# Patient Record
Sex: Female | Born: 1960 | Race: Black or African American | Hispanic: No | Marital: Married | Smoking: Current every day smoker
Health system: Southern US, Community
[De-identification: ages and names within clinical notes are randomized; demographics above are authoritative.]

## PROBLEM LIST (undated history)

## (undated) DIAGNOSIS — I10 Essential (primary) hypertension: Secondary | ICD-10-CM

## (undated) DIAGNOSIS — S0990XA Unspecified injury of head, initial encounter: Secondary | ICD-10-CM

## (undated) DIAGNOSIS — E039 Hypothyroidism, unspecified: Secondary | ICD-10-CM

## (undated) HISTORY — PX: WISDOM TOOTH EXTRACTION: SHX21

## (undated) HISTORY — PX: ABDOMINAL HYSTERECTOMY: SHX81

---

## 1998-06-21 ENCOUNTER — Other Ambulatory Visit: Admission: RE | Admit: 1998-06-21 | Discharge: 1998-06-21 | Payer: Self-pay | Admitting: Obstetrics and Gynecology

## 1999-07-23 ENCOUNTER — Other Ambulatory Visit: Admission: RE | Admit: 1999-07-23 | Discharge: 1999-07-23 | Payer: Self-pay | Admitting: Gynecology

## 2000-03-29 ENCOUNTER — Emergency Department (HOSPITAL_COMMUNITY): Admission: EM | Admit: 2000-03-29 | Discharge: 2000-03-29 | Payer: Self-pay | Admitting: Emergency Medicine

## 2000-07-30 ENCOUNTER — Other Ambulatory Visit: Admission: RE | Admit: 2000-07-30 | Discharge: 2000-07-30 | Payer: Self-pay | Admitting: Gynecology

## 2001-08-26 ENCOUNTER — Other Ambulatory Visit: Admission: RE | Admit: 2001-08-26 | Discharge: 2001-08-26 | Payer: Self-pay | Admitting: Gynecology

## 2003-08-03 ENCOUNTER — Other Ambulatory Visit: Admission: RE | Admit: 2003-08-03 | Discharge: 2003-08-03 | Payer: Self-pay | Admitting: Gynecology

## 2003-09-04 ENCOUNTER — Encounter (INDEPENDENT_AMBULATORY_CARE_PROVIDER_SITE_OTHER): Payer: Self-pay | Admitting: Specialist

## 2003-09-04 ENCOUNTER — Encounter: Payer: Self-pay | Admitting: Cardiology

## 2003-09-04 ENCOUNTER — Inpatient Hospital Stay (HOSPITAL_COMMUNITY): Admission: AD | Admit: 2003-09-04 | Discharge: 2003-09-07 | Payer: Self-pay | Admitting: Gynecology

## 2004-07-18 ENCOUNTER — Emergency Department (HOSPITAL_COMMUNITY): Admission: EM | Admit: 2004-07-18 | Discharge: 2004-07-19 | Payer: Self-pay | Admitting: Emergency Medicine

## 2004-07-29 ENCOUNTER — Encounter: Admission: RE | Admit: 2004-07-29 | Discharge: 2004-07-29 | Payer: Self-pay | Admitting: Cardiology

## 2004-08-07 ENCOUNTER — Ambulatory Visit (HOSPITAL_COMMUNITY): Admission: RE | Admit: 2004-08-07 | Discharge: 2004-08-07 | Payer: Self-pay | Admitting: Cardiology

## 2004-08-26 ENCOUNTER — Emergency Department (HOSPITAL_COMMUNITY): Admission: EM | Admit: 2004-08-26 | Discharge: 2004-08-26 | Payer: Self-pay | Admitting: Emergency Medicine

## 2005-07-02 ENCOUNTER — Ambulatory Visit (HOSPITAL_COMMUNITY): Admission: RE | Admit: 2005-07-02 | Discharge: 2005-07-02 | Payer: Self-pay | Admitting: Obstetrics and Gynecology

## 2005-10-27 IMAGING — CT CT HEAD W/O CM
1 series · 16 of 28 positions shown, 20 images · non-contrast
Comparison: none

CLINICAL DATA: Severe headache with right-sided numbness and weakness.

[Series 2: brain · axial · 0.49mm/px · z∈[+39,+168]mm · 16 of 28 slices shown, 20 images]
[im 2/28  brain]
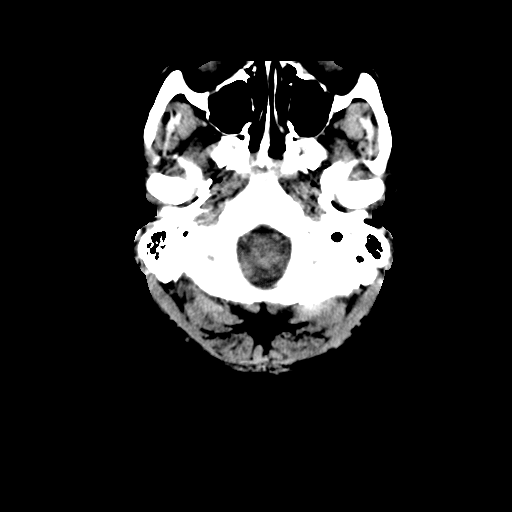
[im 2/28  bone]
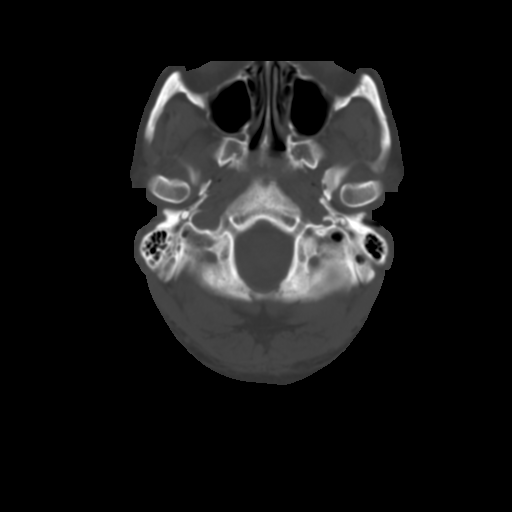
[im 4/28  brain]
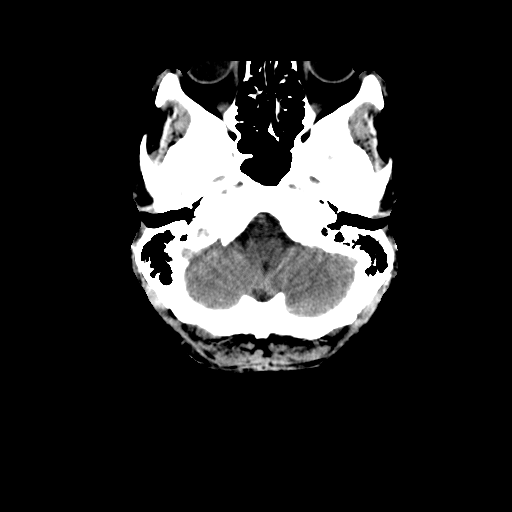
[im 6/28  brain]
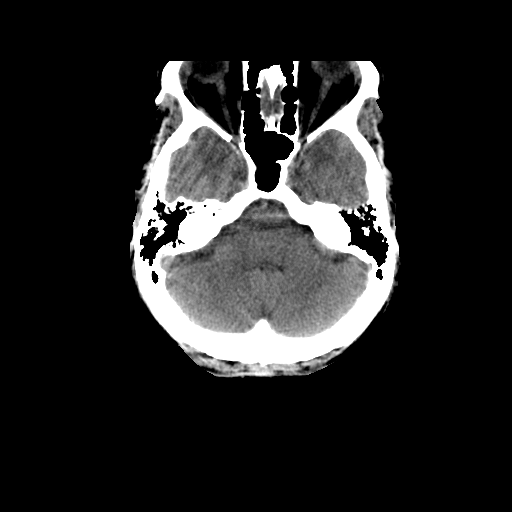
[im 7/28  brain]
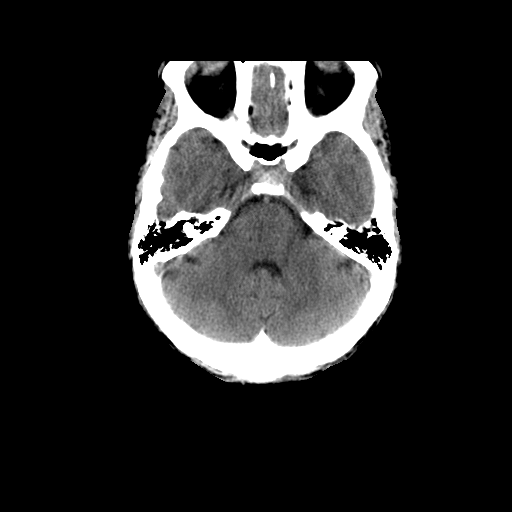
[im 9/28  brain]
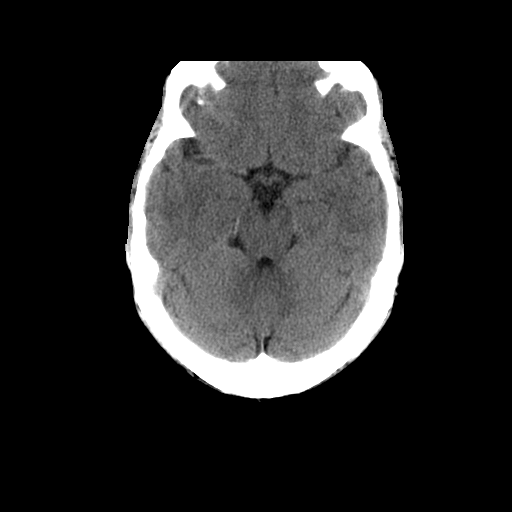
[im 9/28  bone]
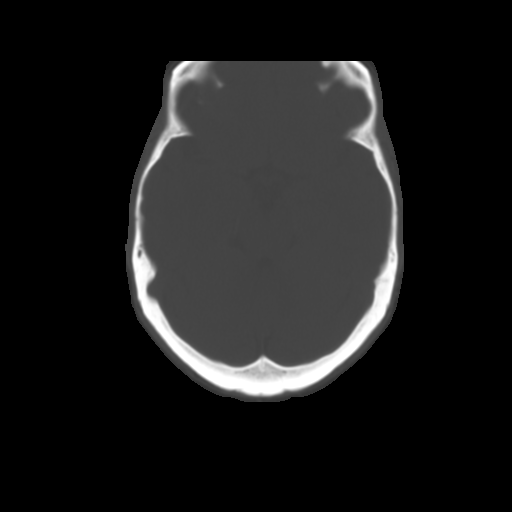
[im 10/28  brain]
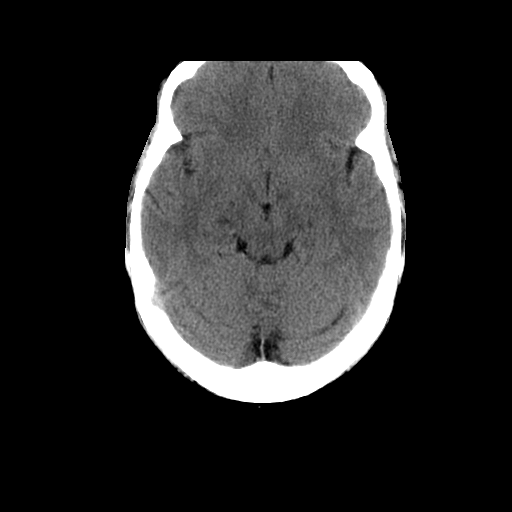
[im 12/28  brain]
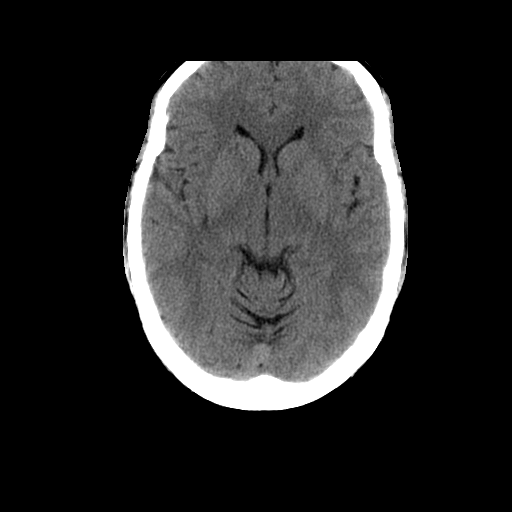
[im 14/28  brain]
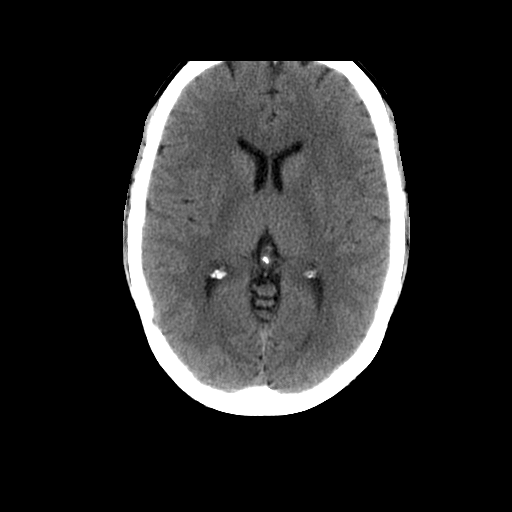
[im 15/28  brain]
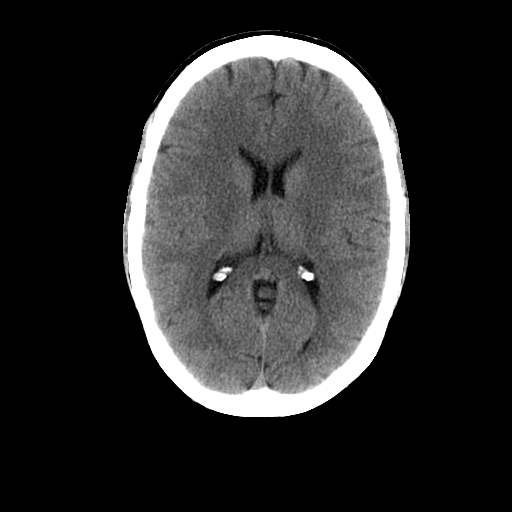
[im 15/28  bone]
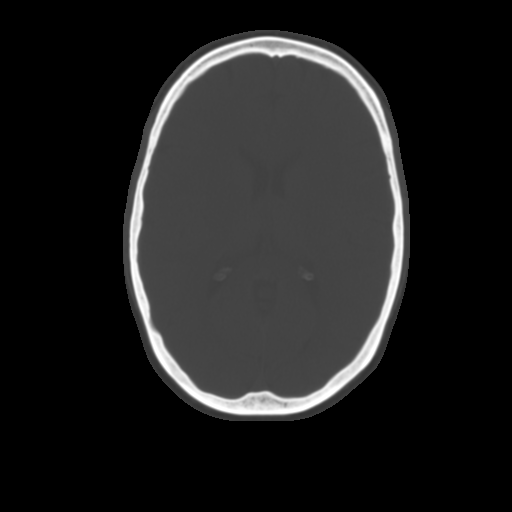
[im 17/28  brain]
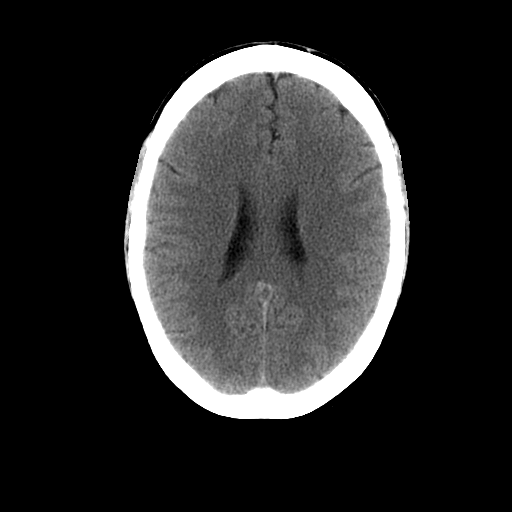
[im 19/28  brain]
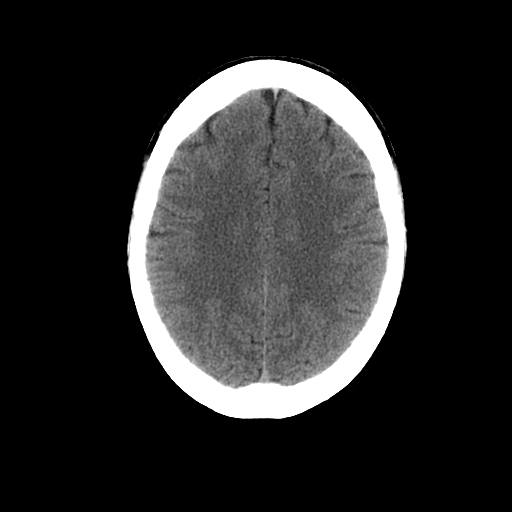
[im 20/28  brain]
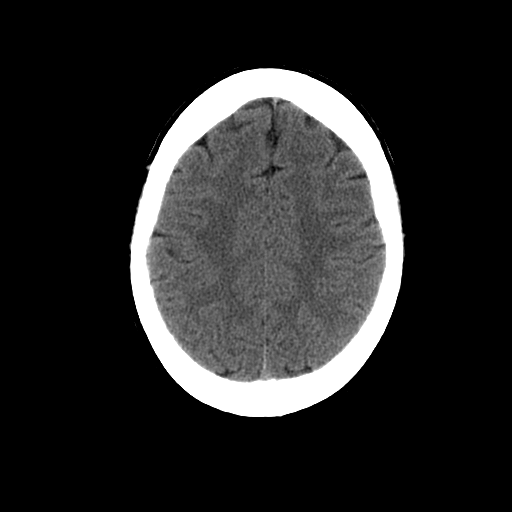
[im 22/28  brain]
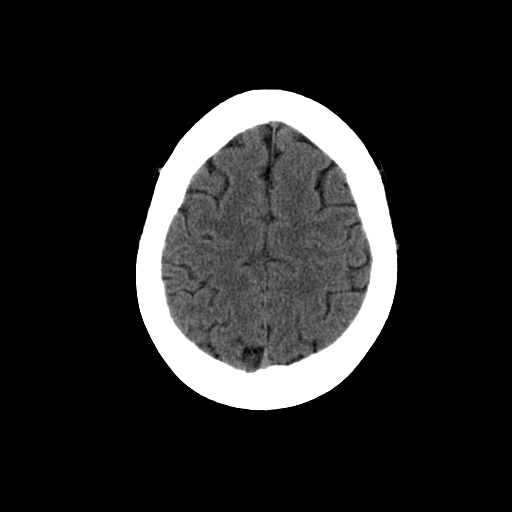
[im 22/28  bone]
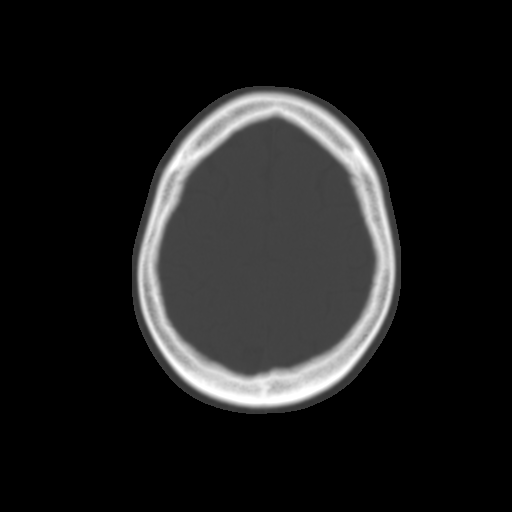
[im 23/28  brain]
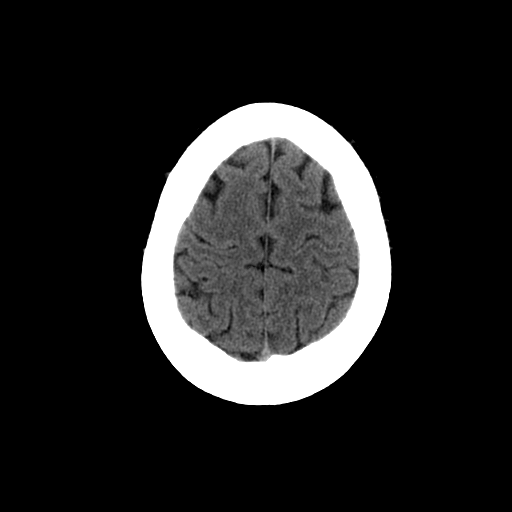
[im 25/28  brain]
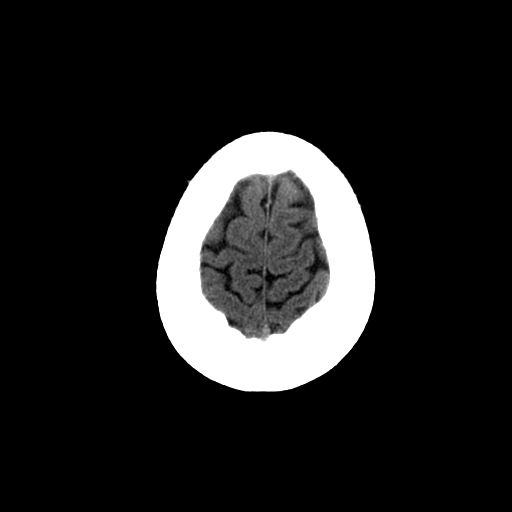
[im 27/28  brain]
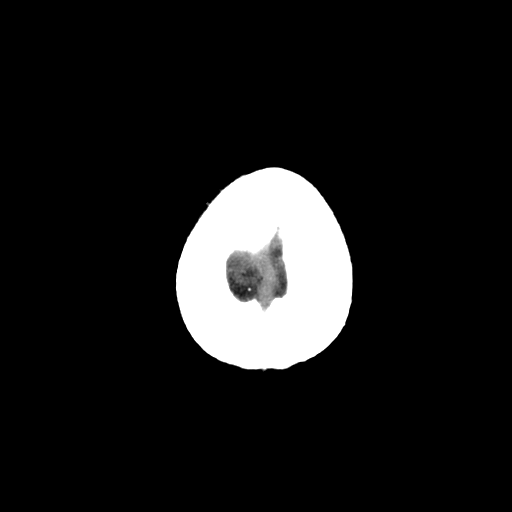

[16 of 28 positions shown; findings below may reference images not displayed]

HEAD CT WITHOUT CONTRAST
 Routine non-contrast head CT was performed. 

 There is no evidence of intracranial hemorrhage, brain edema, or mass effect. The ventricles are normal. No extra-axial abnormalities are identified. Bone windows show no significant abnormalities.

 IMPRESSION
 Negative non-contrast head CT.

## 2008-05-10 ENCOUNTER — Encounter: Admission: RE | Admit: 2008-05-10 | Discharge: 2008-05-10 | Payer: Self-pay | Admitting: Cardiology

## 2011-01-05 ENCOUNTER — Encounter: Payer: Self-pay | Admitting: Cardiology

## 2011-05-02 NOTE — Discharge Summary (Signed)
NAME:  Belinda Rhodes, Belinda Rhodes                      ACCOUNT NO.:  [id number]   MEDICAL RECORD NO.:  [id number]                   PATIENT TYPE:  INP   LOCATION:  9373                                 FACILITY:  [facility]   PHYSICIAN:  Luvenia Redden, M.D.                DATE OF BIRTH:  1961/05/06   DATE OF ADMISSION:  09/04/2003  DATE OF DISCHARGE:  09/07/2003                                 DISCHARGE SUMMARY   BRIEF HISTORY:  This patient is a 50 year old female admitted to the  hospital because of large multiple uterine myomata, menorrhagia, and anemia.  The patient has had known fibroids for several years but they have slowly  grown over the years, her periods have become increasingly heavy to the  point that she has become anemic, at one point her hemoglobin was 6, been  treated with iron to get her up to 10 or 11 so that she could be admitted  for surgery.   Pertinent findings on physical examination were limited to her pelvis where  external genitalia was normal and parous, vagina is lined with healthy  mucosa, the cervix is smooth, the uterus is anterior, irregular and  enlarged, no separate masses can be felt in the adnexa and rectovaginal exam  is negative.   Laboratory data preoperatively on August 31, 2003 showed a hemoglobin of  8.3, hematocrit of 26, white count and differential were within normal  limits.  A postop hemoglobin on September 05, 2003 was 7.3 with hematocrit  of 22.6 and repeat hemoglobin on September 06, 2003 was 7.4 with hematocrit  of 22.6.  Electrolytes were within normal limits.  Liver function was  normal.  Serial CK's were done and were all normal.  She had a lipid profile  done showing a cholesterol of 175, triglycerides 80, HDL 46, VLDL's were 16  and LDL 113.  TSH was normal at 2.636.  Urinalysis was normal for a voided  specimen.  Her blood group was O, Rh positive.  Preop urine pregnancy test  was negative.   COURSE IN THE HOSPITAL:  Patient was  brought into the hospital through day  surgery, taken to the operating room where a total abdominal hysterectomy  was performed, ovaries were conserved because of patient's age and because  they were normal, an appendectomy was also done because of her multiple  fecaliths present in the appendix.  Blood loss was approximately 135 mL,  none was replaced.  Patient tolerated the surgery quite well.  In PACU the  patient experienced a cardiac arrhythmia which did not respond to lidocaine  given by anesthesia and a cardiology consult was requested and done.  Her  anemia of course was noted and she had an actually asymptomatic arrhythmia.  Lab studies by cardiology of course were obtained.  Dr. Tenny Craw felt that she  had frequent ventricular ectopy, no evidence of myocardial infarction, she  was scheduled to be started  on a low dose beta blocker, and she will be  followed up by Dr. Tenny Craw with a Cardiolite stress test with Dr. Tenny Craw.  Transfusion was not done for the patient's anemia because the patient  basically was asymptomatic for that.  Cardiology felt that her arrythmia was  not the result of her anemia.  From a postop standpoint actually the patient  did well, she responded well to pain medication, cath was removed on the  first postop day and she was taking p.o. fluids and she was ambulated and  was asymptomatic except for tenderness in the abdominal surgical area.  By  the first postop evening she was taking fluid but had passed no flatus, yet  she was distended, IV fluids were continued.  On September 06, 2003 she  remained slightly distended, was not passing flatus and bowel sounds were  greatly decreased, she was taking p.o. fluids but minimal in the amount of  solids, lungs were clear and legs were normal.  On September 07, 2003  finally she began to pass flatus, her abdomen had deflated somewhat, staples  were removed from her wound and Steri-Strips applied, patient was discharged  to  home.   She was instructed to eat a regular diet, have limited activity including no  vaginal penetration, no heavy lifting, and no driving.  She is to return to  my office in 3-4 weeks' time for followup care.  She is given a prescription  for [facility] for pain and she was to continue taking her p.o. iron  tablets.  She had an appointment to see Dr. Tenny Craw on September 22, 2003 for  followup care for her arrhythmia.  She was also taking Toprol XL 25 mg  daily.  She was instructed to contact cardiologist oncall for Dr. Tenny Craw in  the event of any chest pain, chest tightness, shortness of breath and to  return to see me to check her operative site in 3-4 weeks' time.   Examination of the tissue removed showed some cervicitis, she had a benign  endometrial polyp, no hyperplasia, she had multiple leiomyomata with focal  degenerative changes, and the appendix was benign and there was no  inflammation.   FINAL DIAGNOSES:  1. Multiple uterine myomata.  2. Benign endometrial polyp.  3. Menorrhagia.  4. Anemia secondary to menorrhagia.  5. Postoperative cardiac arrhythmia, diagnosis frequent ventricular ectopy,     cause undetermined.  6. Tobacco dependence.   OPERATIONS AND PROCEDURES:  1. Total abdominal hysterectomy.  2. Incidental appendectomy.  3. Cardiology consultation.   CONDITION ON DISCHARGE:  Improved.                                               Luvenia Redden, M.D.    WSB/MEDQ  D:  11/01/2003  T:  11/01/2003  Job:  [id number]

## 2011-05-02 NOTE — Consult Note (Signed)
NAME:  Belinda Rhodes, Belinda Rhodes                      ACCOUNT NO.:  [id number]   MEDICAL RECORD NO.:  [id number]                   PATIENT TYPE:  INP   LOCATION:  9373                                 FACILITY:  [facility]   PHYSICIAN:  Pricilla Riffle, M.D.                 DATE OF BIRTH:  1961/01/02   DATE OF CONSULTATION:  09/04/2003  DATE OF DISCHARGE:                                   CONSULTATION   IDENTIFICATION:  The patient is a 50 year old woman who we were asked to see  regarding PVCs.   HISTORY OF PRESENT ILLNESS:  The patient has no known history in the past.  She is now status post hysterectomy/appendectomy this morning.  In the PACU,  she was noted to have frequent PVCs on the monitor.  EKG showed normal sinus  rhythm with ventricular bigeminy.   In talking to the patient, she had an episode of dizziness last week that  she notes she has gotten in the past with her menstrual cycles.  She also  has had occasional episodes of chest tightness with exertion, again she  attributes to her menstrual cycles over the past 1-1/2 years.  No chest pain  at rest.  No palpitations.   ALLERGIES:  None.   CURRENT MEDICATIONS:  Iron q. day.   PAST MEDICAL HISTORY:  1. Menorrhagia.  2. Remote left breast cyst.  3. History of C-section.   FAMILY HISTORY:  Mother 25, no known CAD.  Father died at 97 of an MI.  Sibs  with no CAD.   SOCIAL HISTORY:  The patient lives in [place], is married, drinks rarely,  smokes about a pack per day for the past eight years.  Has not tried  quitting  .   REVIEW OF SYSTEMS:  Reviewed and overall negative to the above problem  except as noted, plus the patient has some reflux symptoms.   PHYSICAL EXAMINATION:  GENERAL:  The patient is in no acute distress.  VITAL SIGNS:  Blood pressure is 148/72, pulse is 80, temperature is 97.7.  The patient is somewhat sleepy, falling asleep and waking up periodically  during examination.  NECK:  JVP is normal, no  bruits.  LUNGS:  Clear.  CARDIAC:  Regular rate and rhythm.  S1, S2, no S3, S4, or significant  murmurs.  Occasional skips.  ABDOMEN:  Benign.  EXTREMITIES:  2+ pulses, no lower extremity edema.   12-lead EKG shows normal sinus rhythm at a rate of 75 beats per minute with  occasional PACs/PVCs.   Labs are significant for a hemoglobin of 8.3, MCV of 75, WBC of 10,  potassium of 4.2, magnesium 1.6.   IMPRESSION:  The patient is a 50 year old, status post hysterectomy, now  with frequent ventricular ectopy.  No nonsustained ventricular tachycardia  by telemetry.  Premature ventricular complexes alone are not a marker for  ischemia.  Will review echocardiogram.  Would continue  with telemetry.  Agree with low dose beta blocker.  This will decrease the cardiac workload  and also most likely decrease the prevalence of the premature ventricular  complexes.  Watch hemoglobin and hematocrit closely.  If it falls further,  would consider transfusion.  Will check cholesterol panel in the morning.   Have talked to the patient.  After discharge, would follow up as an  outpatient assuming nothing else develops and consider a stress test given  her history of chest tightness, and her family history.   Patient is counseled on smoking cessation.                                                Pricilla Riffle, M.D.    PVR/MEDQ  D:  09/04/2003  T:  09/04/2003  Job:  [id number]

## 2011-05-02 NOTE — H&P (Signed)
NAME:  Belinda Rhodes, Belinda Rhodes                      ACCOUNT NO.:  [id number]   MEDICAL RECORD NO.:  [id number]                   PATIENT TYPE:  INP   LOCATION:  9373                                 FACILITY:  [facility]   PHYSICIAN:  Luvenia Redden, M.D.                DATE OF BIRTH:  [birth year]   DATE OF ADMISSION:  09/04/2003  DATE OF DISCHARGE:  09/07/2003                                HISTORY & PHYSICAL   This patient is a 50 year old gravida 1, para 1, admitted to the hospital  because of large fibroids, heavy bleeding and anemia.  The patient has been  known to have fibroids for some time but they have enlarged over the last  couple of years and over the past year, the patient has had very heavy  periods resulting in her hemoglobin being down as low as 7 g.  She was  treated with iron and she responded at one point up to above 10, then she  quit taking her iron and did not return, now has become anemic again.  At  this point, we have gotten her hemoglobin up to a little better than 9 and  she is now going to be admitted for hysterectomy to deal with this problem  permanently.   ALLERGIES:  No known drug allergies.   MEDICATIONS:  She takes Ponstel for her heavy flow when she has a period and  also she takes oral iron.  Otherwise there are no other medications.   PAST MEDICAL HISTORY:  The patient has had the usual childhood diseases.  She has had one pregnancy which went to term.  She has one living child.  She has had no serious medical illnesses.   FAMILY HISTORY:  There is no history of bleeding disorder, sickle cell  disease, breast, colon or ovarian cancer.  There is no history of diabetes,  hypertension, or heart disease.   REVIEW OF SYMPTOMS:  ENT:  No complaints.  Cardiorespiratory:  No  complaints.  GI:  No complaints.  GU:  See present illness.  NEUROMUSCULAR:  No complaints.   PHYSICAL EXAMINATION:  GENERAL APPEARANCE:  This is a well-developed, well-  nourished  50 year old female in no acute distress.  VITAL SIGNS:  Mathew is 5 feet 6 inches tall and weighs 196 pounds.  Her  blood pressure is 120/80, temperature 98.6, pulse 80 per minute and  respirations 14.  Her last menstrual period was one week prior to admission.  Her last PAP smear, which was done in August of 2004, was normal.  HEENT:  Pupils are equal, round, reactive to light and accommodation.  Fundi  are normal.  Ears, nose and throat are normal.  Thyroid is normal size.  LUNGS:  Clear to auscultation and percussion.  BREASTS:  There are no palpable masses.  CARDIOVASCULAR:  Regular rhythm.  No murmurs are heard.  ABDOMEN:  Slightly obese, soft and there is a palpable  suprapubic mass in  the midline which is her uterus and this is palpable about two  fingerbreadths above the symphysis.  SKIN:  Smooth and dry.  EXTREMITIES:  No deformities, no limitation of motion, peripheral pulses are  present.  NEUROLOGIC:  Physiologic.  PELVIC:  External genitalia is normal with a parous outline.  The vagina is  lined with healthy mucosa and the cervix is smooth.  It is well supported.  The uterus is anterior.  It is irregular.  Approximate size is a 12-week  gestation.  No separate adnexal masses can be felt.  The cul-de-sac is free  of masses.  RECTAL:  Negative.  Stool is negative for blood.   IMPRESSION:  1. Multiple uterine myomata.  2. Menorrhagia.  3. Anemia secondary to blood loss.   PLAN:  The patient is being admitted to the hospital for a hysterectomy and  the patient understands that she will no longer have periods and no longer  be able to have children.  The patient understands the possible hazards of  pelvic surgery including, but not limited to, bleeding, infection, injury to  adjacent structures in the pelvis and death.  She agrees to undergo this  procedure.                                               Luvenia Redden, M.D.    WSB/MEDQ  D:  11/01/2003  T:  11/01/2003   Job:  [id number]

## 2011-05-02 NOTE — Op Note (Signed)
NAME:  Belinda Rhodes, Belinda Rhodes                      ACCOUNT NO.:  [id number]   MEDICAL RECORD NO.:  [id number]                   PATIENT TYPE:  INP   LOCATION:  9399                                 FACILITY:  [facility]   PHYSICIAN:  Luvenia Redden, M.D.                DATE OF BIRTH:  1961/10/17   DATE OF PROCEDURE:  09/04/2003  DATE OF DISCHARGE:                                 OPERATIVE REPORT   PREOPERATIVE DIAGNOSES:  1. Uterine myomata.  2. Menorrhagia.  3. Anemia.   POSTOPERATIVE DIAGNOSES:  1. Uterine myomata.  2. Menorrhagia.  3. Anemia.   OPERATION:  1. Total abdominal hysterectomy.  2. Appendectomy.   SURGEON:  Luvenia Redden, M.D.   ASSISTANT:  Malva Limes, M.D.   DESCRIPTION OF PROCEDURE:  Under good anesthesia the patient was prepped and  draped in a sterile manner.  A Foley catheter was in the bladder draining  clear urine.  Abdomen was entered through her Pfannenstiel scar from her  previous cesarean section.  Bleeders were clamped and cauterized as  encountered.  Once the peritoneum was entered and opened up, the abdomen was  explored.  There were some perihepatic adhesions on the dome of the liver.  Everything in the upper abdomen, everything else palpated normally.  Retractor was placed in the incision and the intestines packed off into the  upper abdomen.  The uterus was enlarged with what appeared to be a single  fairly large fibroid.  Tubes and ovaries appeared to be normal.  The pelvic  peritoneum was normal.  The round ligaments were suture ligated, incised,  and the bladder flap developed by sharp and blunt dissection.  The fallopian  tubes and utero-ovarian ligaments were then clamped, cut, and doubly  ligated.  The uterine vessels were skeletonized, clamped, cut, and ligated.  The bladder was dissected further down off of the cervix, upper vagina by  sharp and blunt dissection.  The cardinal ligaments were clamped, cut, and  ligated in two bites.  The  uterosacral ligaments were clamped, cut, and  ligated.  The cervix was circumcised.  The specimen was removed.  The  lateral vaginal apices were secured with figure-of-eight 0 Monocryl.  The  cuff was closed with figure-of-eight 0 Monocryl sutures.  The ovaries were  suspended to the stumps of the round ligaments with a single Monocryl  suture.  The pelvis was irrigated with warm saline that was aspirated.  There was no active bleeding.  The ureters were seen to be functioning  normally.  Packs were removed.  The appendix was inspected and had multiple  fecaliths.  Appendectomy was undertaken.  The appendix was ligated doubly at  its base with 2-0 chromic catgut.  The mesoappendix was ligated and then  separated from the appendix in two bites.  The appendix was clamped at its  base and excised.  The stump was coagulated. The stumps of the  mesentery  were then approximated to the appendiceal stump.  After all the counts were  correct the peritoneum was closed with 2-0 Monocryl, rectus muscles were  approximated with 2-0 Monocryl, the fascia was repaired with continuous 0  Monocryl.  Bleeders in the subcutaneous space were coagulated.  Skin edges  were closed with wide skin staples.  A dry sterile dressing was  applied.  Estimated blood loss was 125-150 mL.  None was replaced.  There  was clear urine coming from the bladder at the end of the procedure.  The  patient tolerated the procedure nicely and was removed to recovery in good  condition.                                               Luvenia Redden, M.D.    WSB/MEDQ  D:  09/04/2003  T:  09/04/2003  Job:  [id number]

## 2012-02-26 ENCOUNTER — Encounter: Payer: Self-pay | Admitting: Gastroenterology

## 2014-11-02 ENCOUNTER — Other Ambulatory Visit: Payer: Self-pay | Admitting: Gynecology

## 2014-11-07 LAB — CYTOLOGY - PAP

## 2014-12-05 ENCOUNTER — Other Ambulatory Visit: Payer: Self-pay | Admitting: Cardiovascular Disease

## 2014-12-05 ENCOUNTER — Ambulatory Visit
Admission: RE | Admit: 2014-12-05 | Discharge: 2014-12-05 | Disposition: A | Discharge status: Home / Self Care | Payer: BC Managed Care – PPO | Source: Ambulatory Visit | Attending: Cardiovascular Disease | Admitting: Cardiovascular Disease

## 2014-12-05 DIAGNOSIS — R51 Headache: Principal | ICD-10-CM

## 2014-12-05 DIAGNOSIS — R52 Pain, unspecified: Secondary | ICD-10-CM

## 2014-12-05 DIAGNOSIS — G8929 Other chronic pain: Secondary | ICD-10-CM

## 2014-12-18 ENCOUNTER — Other Ambulatory Visit: Payer: Self-pay

## 2015-01-07 ENCOUNTER — Emergency Department (HOSPITAL_COMMUNITY): Payer: BLUE CROSS/BLUE SHIELD

## 2015-01-07 ENCOUNTER — Encounter (HOSPITAL_COMMUNITY): Payer: Self-pay | Admitting: Emergency Medicine

## 2015-01-07 ENCOUNTER — Emergency Department (HOSPITAL_COMMUNITY)
Admission: EM | Admit: 2015-01-07 | Discharge: 2015-01-08 | Disposition: A | Discharge status: Home / Self Care | Payer: BLUE CROSS/BLUE SHIELD | Attending: Emergency Medicine | Admitting: Emergency Medicine

## 2015-01-07 DIAGNOSIS — Y92092 Bedroom in other non-institutional residence as the place of occurrence of the external cause: Secondary | ICD-10-CM | POA: Insufficient documentation

## 2015-01-07 DIAGNOSIS — R55 Syncope and collapse: Secondary | ICD-10-CM | POA: Insufficient documentation

## 2015-01-07 DIAGNOSIS — Y9389 Activity, other specified: Secondary | ICD-10-CM | POA: Diagnosis not present

## 2015-01-07 DIAGNOSIS — S199XXA Unspecified injury of neck, initial encounter: Secondary | ICD-10-CM | POA: Diagnosis not present

## 2015-01-07 DIAGNOSIS — Y998 Other external cause status: Secondary | ICD-10-CM | POA: Diagnosis not present

## 2015-01-07 DIAGNOSIS — Z8639 Personal history of other endocrine, nutritional and metabolic disease: Secondary | ICD-10-CM | POA: Insufficient documentation

## 2015-01-07 DIAGNOSIS — I10 Essential (primary) hypertension: Secondary | ICD-10-CM | POA: Diagnosis not present

## 2015-01-07 DIAGNOSIS — R519 Headache, unspecified: Secondary | ICD-10-CM

## 2015-01-07 DIAGNOSIS — Z720 Tobacco use: Secondary | ICD-10-CM | POA: Insufficient documentation

## 2015-01-07 DIAGNOSIS — S0990XA Unspecified injury of head, initial encounter: Secondary | ICD-10-CM | POA: Insufficient documentation

## 2015-01-07 DIAGNOSIS — R51 Headache: Secondary | ICD-10-CM

## 2015-01-07 DIAGNOSIS — W01198A Fall on same level from slipping, tripping and stumbling with subsequent striking against other object, initial encounter: Secondary | ICD-10-CM | POA: Insufficient documentation

## 2015-01-07 HISTORY — DX: Unspecified injury of head, initial encounter: S09.90XA

## 2015-01-07 HISTORY — DX: Hypothyroidism, unspecified: E03.9

## 2015-01-07 HISTORY — DX: Essential (primary) hypertension: I10

## 2015-01-07 LAB — BASIC METABOLIC PANEL
Anion gap: 4 — ABNORMAL LOW (ref 5–15)
BUN: 12 mg/dL (ref 6–23)
CHLORIDE: 102 mmol/L (ref 96–112)
CO2: 33 mmol/L — ABNORMAL HIGH (ref 19–32)
Calcium: 9.4 mg/dL (ref 8.4–10.5)
Creatinine, Ser: 0.98 mg/dL (ref 0.50–1.10)
GFR, EST AFRICAN AMERICAN: 75 mL/min — AB (ref >90)
GFR, EST NON AFRICAN AMERICAN: 65 mL/min — AB (ref >90)
Glucose, Bld: 98 mg/dL (ref 70–99)
POTASSIUM: 3.1 mmol/L — AB (ref 3.5–5.1)
Sodium: 139 mmol/L (ref 135–145)

## 2015-01-07 LAB — CBC WITH DIFFERENTIAL/PLATELET
BASOS ABS: 0 10*3/uL (ref 0.0–0.1)
Basophils Relative: 0 % (ref 0–1)
EOS ABS: 0.1 10*3/uL (ref 0.0–0.7)
Eosinophils Relative: 1 % (ref 0–5)
HEMATOCRIT: 35.8 % — AB (ref 36.0–46.0)
HEMOGLOBIN: 12.2 g/dL (ref 12.0–15.0)
LYMPHS ABS: 2.8 10*3/uL (ref 0.7–4.0)
LYMPHS PCT: 30 % (ref 12–46)
MCH: 30 pg (ref 26.0–34.0)
MCHC: 34.1 g/dL (ref 30.0–36.0)
MCV: 88 fL (ref 78.0–100.0)
MONO ABS: 0.6 10*3/uL (ref 0.1–1.0)
Monocytes Relative: 7 % (ref 3–12)
Neutro Abs: 5.6 10*3/uL (ref 1.7–7.7)
Neutrophils Relative %: 62 % (ref 43–77)
Platelets: 180 10*3/uL (ref 150–400)
RBC: 4.07 MIL/uL (ref 3.87–5.11)
RDW: 14.1 % (ref 11.5–15.5)
WBC: 9.1 10*3/uL (ref 4.0–10.5)

## 2015-01-07 LAB — I-STAT TROPONIN, ED: TROPONIN I, POC: 0 ng/mL (ref 0.00–0.08)

## 2015-01-07 MED ORDER — KETOROLAC TROMETHAMINE 30 MG/ML IJ SOLN
30.0000 mg | Freq: Once | INTRAMUSCULAR | Status: AC
  Administered 2015-01-07: 30 mg via INTRAVENOUS
  Filled 2015-01-07: qty 1

## 2015-01-07 MED ORDER — POTASSIUM CHLORIDE CRYS ER 20 MEQ PO TBCR
40.0000 meq | EXTENDED_RELEASE_TABLET | Freq: Once | ORAL | Status: AC
  Administered 2015-01-07: 40 meq via ORAL
  Filled 2015-01-07: qty 2

## 2015-01-07 NOTE — ED Provider Notes (Signed)
CSN: [id number]     Arrival date & time 01/07/15  2016 History   First MD Initiated Contact with Patient 01/07/15 2025     Chief Complaint  Patient presents with  . Fall  . Emesis     (Consider location/radiation/quality/duration/timing/severity/associated sxs/prior Treatment) HPI Comments: Patient is a 54 year old female with a past medical history of hypertension and hypothyroidism who presents with sudden onset of neck pain that started when she was making dinner this evening. Patient report sudden onset of aching and severe left posterior neck pain that did not radiate. Patient reports having associated room spinning dizziness that lasted a few seconds and then resolved. Patient reports going to lay down on her bed when she felt lightheaded and lost consciousness, hitting her head on the window sill on the way down. When she woke up, she reports nausea and one episode of vomiting. Patient reports improvement of her symptoms at this time No aggravating/alleviating factors. Patient reports this has happened once previously. She is followed by Dr. Algie Coffer and Dr. Sharyn Lull.    Past Medical History  Diagnosis Date  . Hypertension   . Hypothyroid   . Head injury    Past Surgical History  Procedure Laterality Date  . Wisdom tooth extraction    . Abdominal hysterectomy    . Cesarean section     No family history on file. History  Substance Use Topics  . Smoking status: Current Every Day Smoker -- 0.50 packs/day    Types: Cigarettes  . Smokeless tobacco: Not on file  . Alcohol Use: No   OB History    No data available     Review of Systems  Constitutional: Negative for fever, chills and fatigue.  HENT: Negative for trouble swallowing.   Eyes: Negative for visual disturbance.  Respiratory: Negative for shortness of breath.   Cardiovascular: Negative for chest pain and palpitations.  Gastrointestinal: Positive for vomiting. Negative for nausea, abdominal pain and diarrhea.   Genitourinary: Negative for dysuria and difficulty urinating.  Musculoskeletal: Positive for neck pain. Negative for arthralgias.  Skin: Negative for color change.  Neurological: Positive for headaches. Negative for dizziness and weakness.  Psychiatric/Behavioral: Negative for dysphoric mood.      Allergies  Review of patient's allergies indicates no known allergies.  Home Medications   Prior to Admission medications   Not on File   BP 102/49 mmHg  Pulse 66  Temp(Src) 97.9 F (36.6 C) (Oral)  Resp 14  Ht  (1.676 m)  Wt 204 lb (92.534 kg)  BMI 32.94 kg/m2  SpO2 99% Physical Exam  Constitutional: She is oriented to person, place, and time. She appears well-developed and well-nourished. No distress.  HENT:  Head: Normocephalic and atraumatic.  Mouth/Throat: No oropharyngeal exudate.  Eyes: Conjunctivae and EOM are normal. Pupils are equal, round, and reactive to light.  Neck: Normal range of motion.  Cardiovascular: Normal rate and regular rhythm.  Exam reveals no gallop and no friction rub.   No murmur heard. Negative George's test.   Pulmonary/Chest: Effort normal and breath sounds normal. She has no wheezes. She has no rales. She exhibits no tenderness.  Abdominal: Soft. She exhibits no distension. There is no tenderness. There is no rebound.  Musculoskeletal: Normal range of motion.  No midline spine tenderness to palpation.   Neurological: She is alert and oriented to person, place, and time. No cranial nerve deficit. Coordination normal.  Extremity strength and sensation equal and intact bilaterally. Speech is goal-oriented. Moves  limbs without ataxia.   Skin: Skin is warm and dry.  Psychiatric: She has a normal mood and affect. Her behavior is normal.  Nursing note and vitals reviewed.   ED Course  Procedures (including critical care time) Labs Review Labs Reviewed  CBC WITH DIFFERENTIAL/PLATELET - Abnormal; Notable for the following:    HCT 35.8 (*)     All other components within normal limits  BASIC METABOLIC PANEL - Abnormal; Notable for the following:    Potassium 3.1 (*)    CO2 33 (*)    GFR calc non Af Amer 65 (*)    GFR calc Af Amer 75 (*)    Anion gap 4 (*)    All other components within normal limits  I-STAT TROPOININ, ED    Imaging Review Ct Head Wo Contrast  01/07/2015   CLINICAL DATA:  Head injury after a fall, striking head on bed and dresser. Vomiting. Syncope. Struck left frontal and maxillary region.  EXAM: CT HEAD WITHOUT CONTRAST  CT CERVICAL SPINE WITHOUT CONTRAST  TECHNIQUE: Multidetector CT imaging of the head and cervical spine was performed following the standard protocol without intravenous contrast. Multiplanar CT image reconstructions of the cervical spine were also generated.  COMPARISON:  CT head 07/29/2004. Cervical spine radiographs 12/05/2014.  FINDINGS: CT HEAD FINDINGS  Ventricles and sulci appear symmetrical. Slight left frontal subcutaneous scalp hematoma. No mass effect or midline shift. No abnormal extra-axial fluid collections. Gray-white matter junctions are distinct. Basal cisterns are not effaced. No evidence of acute intracranial hemorrhage. No depressed skull fractures. Visualized paranasal sinuses and mastoid air cells are not opacified.  CT CERVICAL SPINE FINDINGS  Straightening of the usual cervical lordosis which may be due to patient positioning but ligamentous injury or muscle spasm could also have this appearance and are not excluded. Degenerative changes throughout the cervical spine with narrowed cervical interspaces and endplate hypertrophic changes. No anterior subluxation. Normal alignment of cervical facet joints. Degenerative changes throughout the facet joints. C1-2 articulation appears intact. No vertebral compression deformities. No prevertebral soft tissue swelling. No focal bone lesion or bone destruction. Bone cortex and trabecular architecture appear intact. Soft tissues are unremarkable.   IMPRESSION: No acute intracranial abnormalities. Small left frontal subcutaneous scalp hematoma.  Degenerative changes throughout the cervical spine. Nonspecific straightening of the usual cervical lordosis. No displaced fractures identified.   Electronically Signed   By: Burman Nieves M.D.   On: 01/07/2015 21:32   Ct Cervical Spine Wo Contrast  01/07/2015   CLINICAL DATA:  Head injury after a fall, striking head on bed and dresser. Vomiting. Syncope. Struck left frontal and maxillary region.  EXAM: CT HEAD WITHOUT CONTRAST  CT CERVICAL SPINE WITHOUT CONTRAST  TECHNIQUE: Multidetector CT imaging of the head and cervical spine was performed following the standard protocol without intravenous contrast. Multiplanar CT image reconstructions of the cervical spine were also generated.  COMPARISON:  CT head 07/29/2004. Cervical spine radiographs 12/05/2014.  FINDINGS: CT HEAD FINDINGS  Ventricles and sulci appear symmetrical. Slight left frontal subcutaneous scalp hematoma. No mass effect or midline shift. No abnormal extra-axial fluid collections. Gray-white matter junctions are distinct. Basal cisterns are not effaced. No evidence of acute intracranial hemorrhage. No depressed skull fractures. Visualized paranasal sinuses and mastoid air cells are not opacified.  CT CERVICAL SPINE FINDINGS  Straightening of the usual cervical lordosis which may be due to patient positioning but ligamentous injury or muscle spasm could also have this appearance and are not excluded. Degenerative changes throughout the  cervical spine with narrowed cervical interspaces and endplate hypertrophic changes. No anterior subluxation. Normal alignment of cervical facet joints. Degenerative changes throughout the facet joints. C1-2 articulation appears intact. No vertebral compression deformities. No prevertebral soft tissue swelling. No focal bone lesion or bone destruction. Bone cortex and trabecular architecture appear intact. Soft tissues  are unremarkable.  IMPRESSION: No acute intracranial abnormalities. Small left frontal subcutaneous scalp hematoma.  Degenerative changes throughout the cervical spine. Nonspecific straightening of the usual cervical lordosis. No displaced fractures identified.   Electronically Signed   By: Burman NievesWilliam  Stevens M.D.   On: 01/07/2015 21:32     EKG Interpretation None      MDM   Final diagnoses:  Headache  Syncope    9:43 PM CT head and neck unremarkable for acute changes. Vitals stable and patient afebrile. Labs pending.   12:17 AM Labs unremarkable for acute changes. Vitals stable and patient afebrile. Patient reports improvement of symptoms. Dr. Micheline Mazeocherty saw the patient who agrees the patient can be discharged with PCP follow up. No neurovascular deficits noted. Vitals stable and patient afebrile. Patient likely had a vagal response due to muscle spasm. 2  HurdsfieldKaitlyn Vernona Peake, [place] 01/08/15 0031

## 2015-01-07 NOTE — ED Provider Notes (Signed)
Date: 01/07/2015  Rate: 62  Rhythm: normal sinus rhythm  QRS Axis: normal  Intervals: normal  ST/T Wave abnormalities: normal and anteroseptal infarct, old  Conduction Disutrbances:none  Narrative Interpretation:   Old EKG Reviewed: none available  Medical screening examination/treatment/procedure(s) were conducted as a shared visit with non-physician practitioner(s) and myself.  I personally evaluated the patient during the encounter. Pt presents after a syncopal episode proceeded by L lateral neck pain, lightheadedness, room spinning. She had also bent over and had just urinated prior to syncope. SHe denies proceeding CP, palpitations, SOB. She had h/a currently. No focal neuro findings on exam, though states she has had neck problems and sometimes get BL arm paresthesias. EKG with no acute ischemic findings. CT head nml. Suspect vasovagal episode, doubt SAH, CVA/TIA   EKG Interpretation None        Toy CookeyMegan Docherty, MD 01/08/15 [phone]

## 2015-01-07 NOTE — ED Notes (Signed)
Pt arrives from home with c/o head injury/fall, states she hit her head on bed and then on dresser, then vomited. 18G placed in L hand.

## 2015-01-08 MED ORDER — METHOCARBAMOL 500 MG PO TABS: 500.0000 mg | ORAL_TABLET | Freq: Two times a day (BID) | ORAL | Status: DC

## 2015-01-08 NOTE — Discharge Instructions (Signed)
Take Robaxin as needed for muscle spasm. Refer to attached documents for more information. Follow up with your doctor for further evaluation.

## 2016-03-04 IMAGING — CR DG LUMBAR SPINE COMPLETE 4+V
5 series · 5 of 5 positions shown · non-contrast
Comparison: Prior radiographs of the abdomen 05/10/2008

CLINICAL DATA: 53-year-old female with lower lumbar pain and lower
extremity weakness. She has a history of falling in her bathroom
approximately 6 months previously.

EXAM:
LUMBAR SPINE - COMPLETE 4+ VIEW

[t l-spine a.p.]
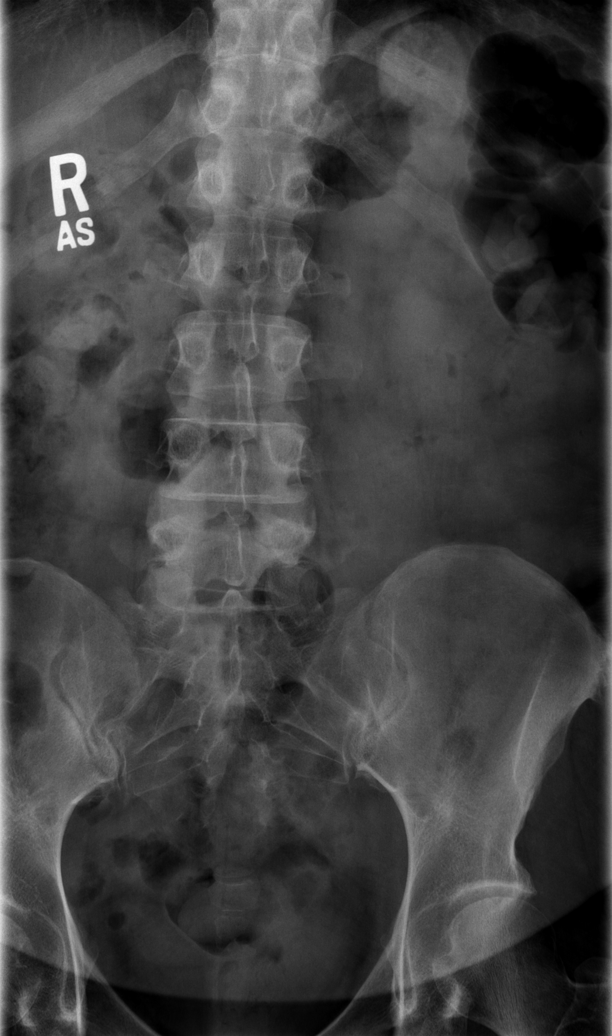

[t l-spine oblique exposure (1 of 2)]
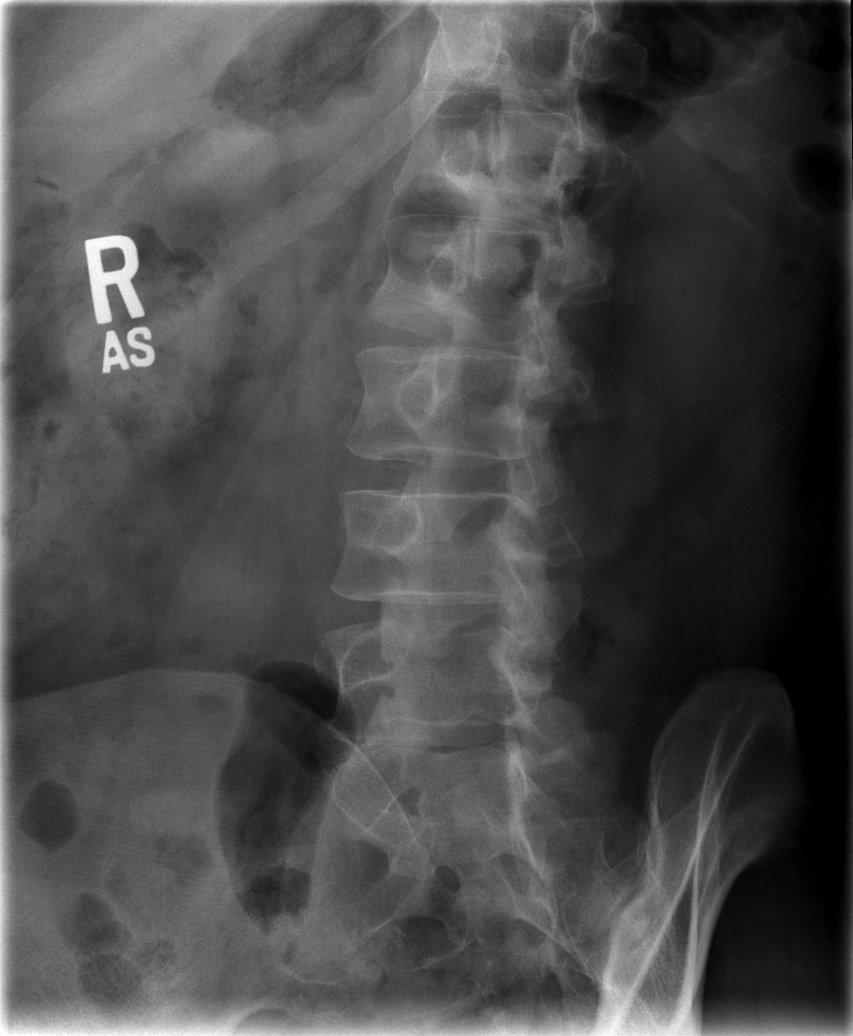

[t l-spine oblique exposure (2 of 2)]
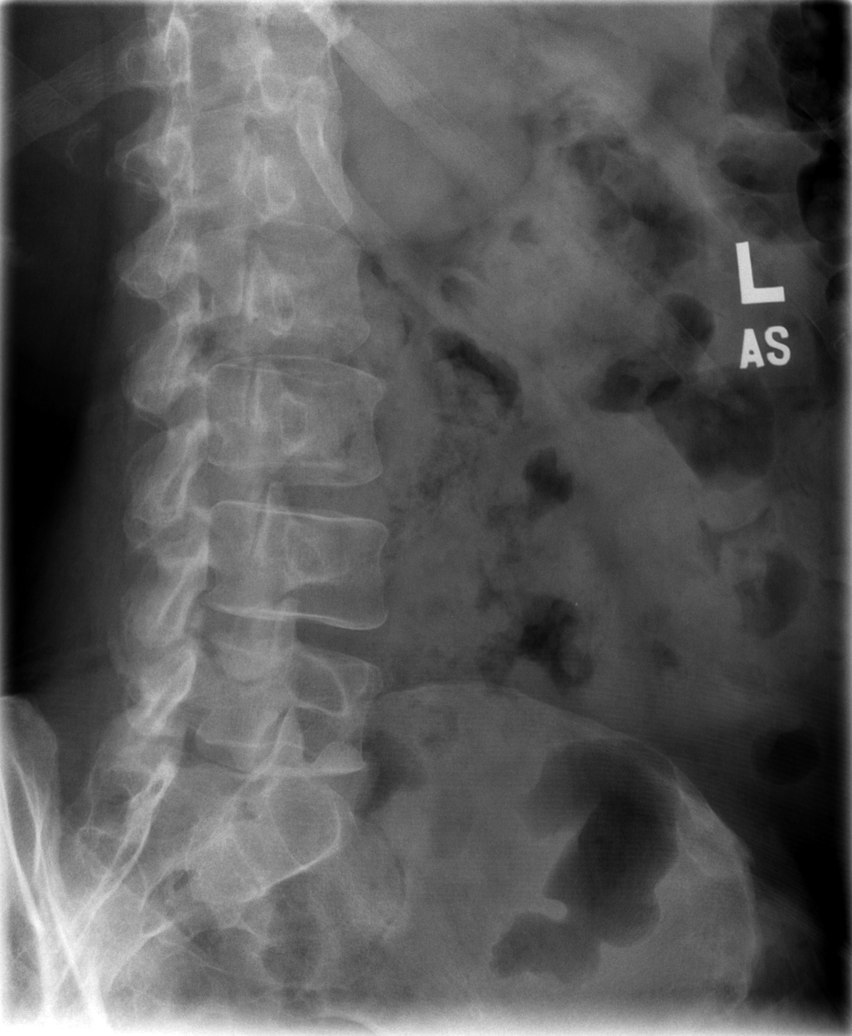

[t l-spine lat]
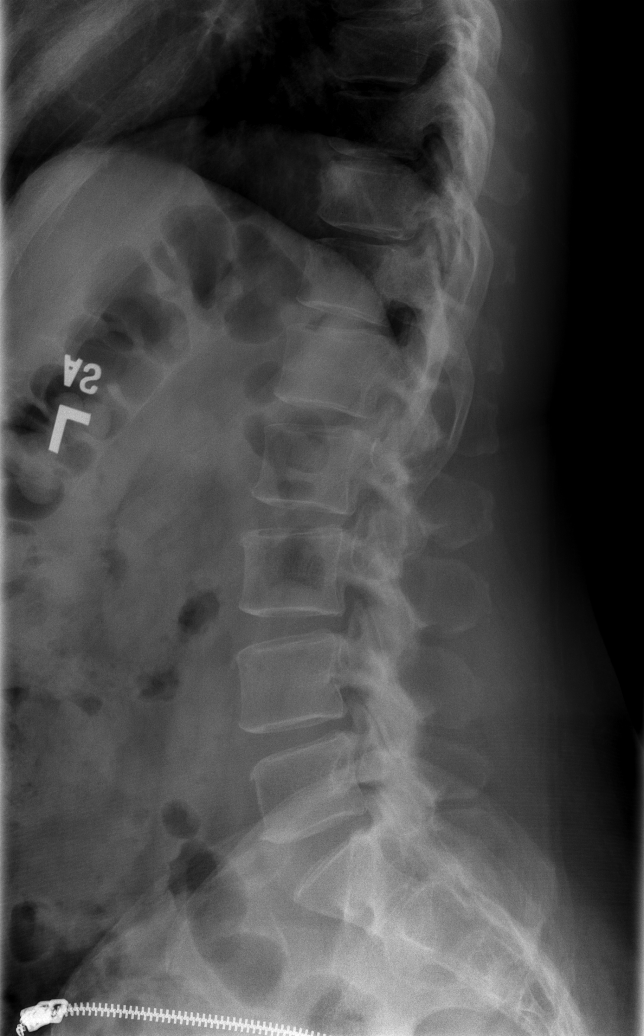

[t l-spine l5-s1 spot]
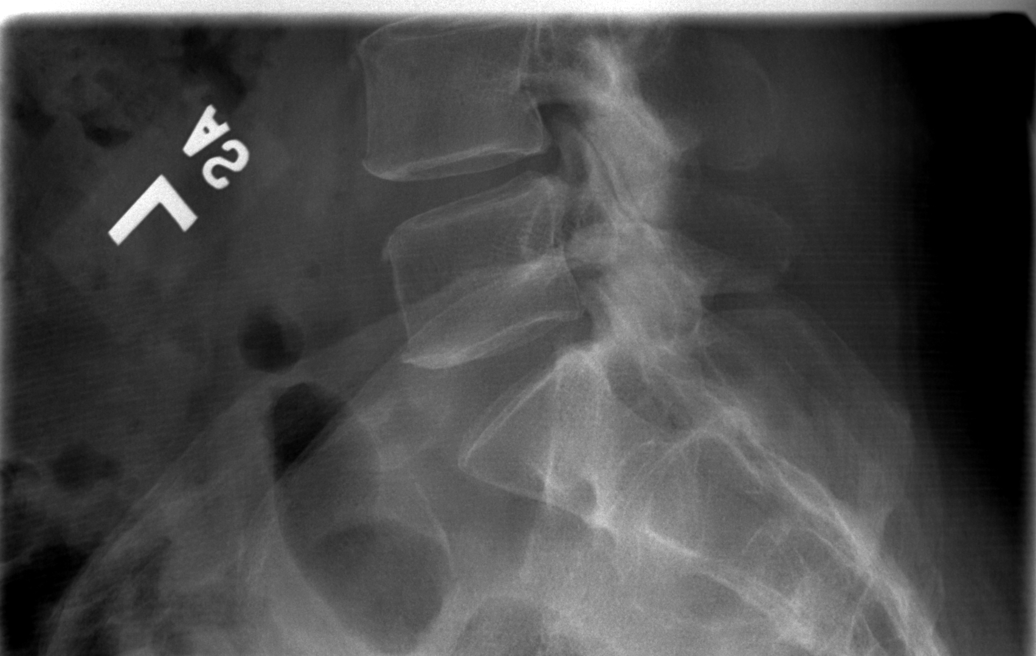

[5 of 5 positions shown; findings below may reference images not displayed]

FINDINGS: No evidence of acute fracture or malalignment. Very minimal change
of degenerative disc disease at L5-S1. No significant facet
arthropathy. Normal bony mineralization. No lytic or blastic osseous
lesion. The visualized bowel gas pattern is unremarkable. No
abnormal calcifications.
IMPRESSION: 1. Minimal degenerative disc disease at L5-S1.
2. Otherwise, unremarkable.

## 2018-09-19 ENCOUNTER — Encounter (HOSPITAL_COMMUNITY): Payer: Self-pay

## 2018-09-19 ENCOUNTER — Ambulatory Visit (HOSPITAL_COMMUNITY)
Admission: EM | Admit: 2018-09-19 | Discharge: 2018-09-19 | Disposition: A | Discharge status: Home / Self Care | Payer: BLUE CROSS/BLUE SHIELD | Attending: Internal Medicine | Admitting: Internal Medicine

## 2018-09-19 DIAGNOSIS — K047 Periapical abscess without sinus: Secondary | ICD-10-CM

## 2018-09-19 DIAGNOSIS — R220 Localized swelling, mass and lump, head: Secondary | ICD-10-CM

## 2018-09-19 MED ORDER — AMOXICILLIN-POT CLAVULANATE 875-125 MG PO TABS: 1.0000 | ORAL_TABLET | Freq: Two times a day (BID) | ORAL | 0 refills | Status: AC

## 2018-09-19 NOTE — Discharge Instructions (Signed)
Take Augmentin twice daily for 1 week Use anti-inflammatories for pain/swelling. You may take up to 800 mg Ibuprofen every 8 hours with food. You may supplement Ibuprofen with Tylenol [phone] mg every 8 hours.  Warm compresses to swelling  Follow up if developing worsening swelling, difficulty moving neck, difficulty swallowing, fevers, increased pain

## 2018-09-19 NOTE — ED Triage Notes (Signed)
Pt presents with a swollen jaw not related to any injury or anything in particular; more intense pain with touch.

## 2018-09-19 NOTE — ED Notes (Signed)
Bed: UC01 Expected date:  Expected time:  Means of arrival:  Comments: For appts 

## 2018-09-20 ENCOUNTER — Encounter (HOSPITAL_COMMUNITY): Payer: Self-pay | Admitting: Emergency Medicine

## 2018-09-20 ENCOUNTER — Ambulatory Visit (HOSPITAL_COMMUNITY)
Admission: EM | Admit: 2018-09-20 | Discharge: 2018-09-20 | Disposition: A | Discharge status: Home / Self Care | Payer: Self-pay | Attending: Family Medicine | Admitting: Family Medicine

## 2018-09-20 DIAGNOSIS — R220 Localized swelling, mass and lump, head: Secondary | ICD-10-CM

## 2018-09-20 MED ORDER — CEFTRIAXONE SODIUM 1 G IJ SOLR
1.0000 g | Freq: Once | INTRAMUSCULAR | Status: AC
  Administered 2018-09-20: 1 g via INTRAMUSCULAR

## 2018-09-20 MED ORDER — CEFTRIAXONE SODIUM 250 MG IJ SOLR
INTRAMUSCULAR | Status: AC
  Filled 2018-09-20: qty 250

## 2018-09-20 MED ORDER — LIDOCAINE HCL (PF) 1 % IJ SOLN
INTRAMUSCULAR | Status: AC
  Filled 2018-09-20: qty 2

## 2018-09-20 MED ORDER — CEFTRIAXONE SODIUM 1 G IJ SOLR
INTRAMUSCULAR | Status: AC
  Filled 2018-09-20: qty 10

## 2018-09-20 NOTE — ED Provider Notes (Addendum)
[facility]    CSN: [id number] Arrival date & time: 09/20/18  1009     History   Chief Complaint Chief Complaint  Patient presents with  . Follow-up  . Facial Swelling    HPI Belinda Rhodes is a 57 y.o. female.   Pt here for recheck of facial swelling.  Pt reports swelling is worse today.  Pt seen here yesterday and started on antibiotic.  Pt denies fever,  Pt reports no toothache  The history is provided by the patient. No language interpreter was used.    Past Medical History:  Diagnosis Date  . Head injury   . Hypertension   . Hypothyroid     There are no active problems to display for this patient.   Past Surgical History:  Procedure Laterality Date  . ABDOMINAL HYSTERECTOMY    . CESAREAN SECTION    . WISDOM TOOTH EXTRACTION      OB History   None      Home Medications    Prior to Admission medications   Medication Sig Start Date End Date Taking? Authorizing Provider  amoxicillin-clavulanate (AUGMENTIN) 875-125 MG tablet Take 1 tablet by mouth every 12 (twelve) hours for 7 days. 09/19/18 09/26/18 Yes Wieters, Hallie C, PA-C  levothyroxine (SYNTHROID, LEVOTHROID) 50 MCG tablet Take 50 mcg by mouth daily before breakfast.   Yes [provider]  Olmesartan-Amlodipine-HCTZ 40-5-25 MG TABS Take 1 tablet by mouth daily.   Yes [provider]  rosuvastatin (CRESTOR) 20 MG tablet Take 20 mg by mouth daily.   Yes [provider]  methocarbamol (ROBAXIN) 500 MG tablet Take 1 tablet (500 mg total) by mouth 2 (two) times daily. 01/08/15   Szekalski, Kaitlyn, PA-C  nebivolol (BYSTOLIC) 10 MG tablet Take 10 mg by mouth daily.    [provider]    Family History No family history on file.  Social History Social History   Tobacco Use  . Smoking status: Current Every Day Smoker    Packs/day: 0.50    Types: Cigarettes  Substance Use Topics  . Alcohol use: No  . Drug use: No     Allergies   Patient has no  known allergies.   Review of Systems Review of Systems  All other systems reviewed and are negative.    Physical Exam Triage Vital Signs ED Triage Vitals  Enc Vitals Group     BP 09/20/18 1047 132/75     Pulse Rate 09/20/18 1045 85     Resp 09/20/18 1045 16     Temp 09/20/18 1045 98.4 F (36.9 C)     Temp Source 09/20/18 1045 Oral     SpO2 09/20/18 1045 100 %     Weight --      Height --      Head Circumference --      Peak Flow --      Pain Score 09/20/18 1046 6     Pain Loc --      Pain Edu? --      Excl. in GC? --    No data found.  Updated Vital Signs BP 132/75   Pulse 85   Temp 98.4 F (36.9 C) (Oral)   Resp 16   SpO2 100%   Visual Acuity Right Eye Distance:   Left Eye Distance:   Bilateral Distance:    Right Eye Near:   Left Eye Near:    Bilateral Near:     Physical Exam  Constitutional: She  appears well-developed.  HENT:  Head: Normocephalic.  Swelling left face, upper and lower,    Musculoskeletal: Normal range of motion.  Neurological: She is alert.  Skin: Skin is warm.  Psychiatric: She has a normal mood and affect.  Nursing note and vitals reviewed.    UC Treatments / Results  Labs (all labs ordered are listed, but only abnormal results are displayed) Labs Reviewed - No data to display  EKG None  Radiology No results found.  Procedures Procedures (including critical care time)  Medications Ordered in UC Medications - No data to display  Initial Impression / Assessment and Plan / UC Course  I have reviewed the triage vital signs and the nursing notes.  Pertinent labs & imaging results that were available during my care of the patient were reviewed by me and considered in my medical decision making (see chart for details).    Rocephin 1 gram IM.  Pt advised if no improvement to go to Ed for ct scan  Final Clinical Impressions(s) / UC Diagnoses   Final diagnoses:  Facial swelling     Discharge Instructions     Go to  the ED for evaluation if swelling does not begin to improve     ED Prescriptions    None     Controlled Substance Prescriptions [place] Controlled Substance Registry consulted? Not Applicable   Osie Cheeks 09/20/18 1127    Elson Areas, [place] 09/20/18 1139

## 2018-09-20 NOTE — Discharge Instructions (Addendum)
Go to the ED for evaluation if swelling does not begin to improve

## 2018-09-20 NOTE — ED Notes (Signed)
Clarified with dr Milus Glazier, patient to continue taking antibiotics- po as prescribed

## 2018-09-20 NOTE — ED Triage Notes (Signed)
PT reports swollen left jaw for 2 days. PT was seen here yesterday for same. PT was started on amoxicillin. PT feels like she may be having hives related to antibiotic as well.   Swelling and pain is worse today.   No airway involvement.

## 2018-09-20 NOTE — ED Provider Notes (Signed)
[facility]    CSN: [id number] Arrival date & time: 09/19/18  1510     History   Chief Complaint Chief Complaint  Patient presents with  . Swollen Jaw    HPI Belinda Rhodes is a 57 y.o. female no contributing past medical history presenting today for evaluation of facial swelling.  Patient states that over the past 2 days she has had swelling to her left lower jaw, woke up this morning and had increased in size.  States that she does have bad teeth in this area, but it does not have pain around her tooth.  Does have tenderness to the area of swelling.  Denies ear pain, denies fevers.  Patient has not taken anything for symptoms.  Denies difficulty moving neck or any neck swelling.  HPI  Past Medical History:  Diagnosis Date  . Head injury   . Hypertension   . Hypothyroid     There are no active problems to display for this patient.   Past Surgical History:  Procedure Laterality Date  . ABDOMINAL HYSTERECTOMY    . CESAREAN SECTION    . WISDOM TOOTH EXTRACTION      OB History   None      Home Medications    Prior to Admission medications   Medication Sig Start Date End Date Taking? Authorizing Provider  amoxicillin-clavulanate (AUGMENTIN) 875-125 MG tablet Take 1 tablet by mouth every 12 (twelve) hours for 7 days. 09/19/18 09/26/18  Ariana Cavenaugh C, PA-C  levothyroxine (SYNTHROID, LEVOTHROID) 50 MCG tablet Take 50 mcg by mouth daily before breakfast.    [provider]  methocarbamol (ROBAXIN) 500 MG tablet Take 1 tablet (500 mg total) by mouth 2 (two) times daily. 01/08/15   Szekalski, Kaitlyn, PA-C  nebivolol (BYSTOLIC) 10 MG tablet Take 10 mg by mouth daily.    [provider]  Olmesartan-Amlodipine-HCTZ 40-5-25 MG TABS Take 1 tablet by mouth daily.    [provider]  rosuvastatin (CRESTOR) 20 MG tablet Take 20 mg by mouth daily.    [provider]    Family History History reviewed. No pertinent family  history.  Social History Social History   Tobacco Use  . Smoking status: Current Every Day Smoker    Packs/day: 0.50    Types: Cigarettes  Substance Use Topics  . Alcohol use: No  . Drug use: No     Allergies   Patient has no known allergies.   Review of Systems Review of Systems  Constitutional: Negative for fatigue and fever.  HENT: Positive for facial swelling. Negative for dental problem, sore throat and trouble swallowing.   Eyes: Negative for visual disturbance.  Respiratory: Negative for chest tightness and shortness of breath.   Cardiovascular: Negative for chest pain.  Gastrointestinal: Negative for abdominal pain, nausea and vomiting.  Musculoskeletal: Negative for arthralgias, joint swelling, neck pain and neck stiffness.  Skin: Negative for color change, rash and wound.  Neurological: Negative for dizziness, weakness, light-headedness and headaches.     Physical Exam Triage Vital Signs ED Triage Vitals [09/19/18 1549]  Enc Vitals Group     BP (!) 112/93     Pulse Rate 85     Resp 20     Temp 98.8 F (37.1 C)     Temp Source Temporal     SpO2 100 %     Weight      Height      Head Circumference      Peak Flow  Pain Score 7     Pain Loc      Pain Edu?      Excl. in GC?    No data found.  Updated Vital Signs BP (!) 112/93 (BP Location: Left Arm)   Pulse 85   Temp 98.8 F (37.1 C) (Temporal)   Resp 20   SpO2 100%   Visual Acuity Right Eye Distance:   Left Eye Distance:   Bilateral Distance:    Right Eye Near:   Left Eye Near:    Bilateral Near:     Physical Exam  Constitutional: She is oriented to person, place, and time. She appears well-developed and well-nourished.  No acute distress  HENT:  Head: Normocephalic and atraumatic.  Nose: Nose normal.  Bilateral EACs and TMs nonerythematous  Left lower jaw with swelling and tenderness to palpation, no overlying erythema, swelling does not extend into neck  Nontender to  palpation around posterior molars, no obvious gingival swelling; no swelling of soft palate posteriorly, no uvula swelling or deviation, posterior pharynx patent  Nontender to palpation of soft palate below the tongue  Eyes: Conjunctivae are normal.  Neck: Neck supple.  Full active range of motion of neck, no neck swelling or erythema  Cardiovascular: Normal rate.  Pulmonary/Chest: Effort normal. No respiratory distress.  Breathing comfortably at rest, CTABL, no wheezing, rales or other adventitious sounds auscultated  Abdominal: She exhibits no distension.  Musculoskeletal: Normal range of motion.  Neurological: She is alert and oriented to person, place, and time.  Skin: Skin is warm and dry.  Psychiatric: She has a normal mood and affect.  Nursing note and vitals reviewed.    UC Treatments / Results  Labs (all labs ordered are listed, but only abnormal results are displayed) Labs Reviewed - No data to display  EKG None  Radiology No results found.  Procedures Procedures (including critical care time)  Medications Ordered in UC Medications - No data to display  Initial Impression / Assessment and Plan / UC Course  I have reviewed the triage vital signs and the nursing notes.  Pertinent labs & imaging results that were available during my care of the patient were reviewed by me and considered in my medical decision making (see chart for details).     Facial swelling to left lower jaw, possible dental abscess given location versus infected or enlarged salivary gland.  Will begin patient on Augmentin, Tylenol and ibuprofen for the discomfort, warm compresses, continue to monitor pain and swelling, follow-up if swelling worsening, developing swelling or stiffness in the neck, fevers or worsening symptoms.Discussed strict return precautions. Patient verbalized understanding and is agreeable with plan.  Final Clinical Impressions(s) / UC Diagnoses   Final diagnoses:  Facial  swelling  Dental abscess     Discharge Instructions     Take Augmentin twice daily for 1 week Use anti-inflammatories for pain/swelling. You may take up to 800 mg Ibuprofen every 8 hours with food. You may supplement Ibuprofen with Tylenol [phone] mg every 8 hours.  Warm compresses to swelling  Follow up if developing worsening swelling, difficulty moving neck, difficulty swallowing, fevers, increased pain   ED Prescriptions    Medication Sig Dispense Auth. Provider   amoxicillin-clavulanate (AUGMENTIN) 875-125 MG tablet Take 1 tablet by mouth every 12 (twelve) hours for 7 days. 14 tablet Malayah Demuro, [place] C, PA-C     Controlled Substance Prescriptions [place] Controlled Substance Registry consulted? Not Applicable   Lew Dawes, [place] 09/20/18 1108

## 2021-04-22 ENCOUNTER — Ambulatory Visit
Admission: EM | Admit: 2021-04-22 | Discharge: 2021-04-22 | Disposition: A | Discharge status: Home / Self Care | Payer: 59 | Attending: Emergency Medicine | Admitting: Emergency Medicine

## 2021-04-22 ENCOUNTER — Other Ambulatory Visit: Payer: Self-pay

## 2021-04-22 DIAGNOSIS — R1084 Generalized abdominal pain: Secondary | ICD-10-CM | POA: Diagnosis not present

## 2021-04-22 DIAGNOSIS — R110 Nausea: Secondary | ICD-10-CM | POA: Diagnosis not present

## 2021-04-22 MED ORDER — ONDANSETRON 4 MG PO TBDP: 4.0000 mg | ORAL_TABLET | Freq: Three times a day (TID) | ORAL | 0 refills | Status: DC | PRN

## 2021-04-22 NOTE — Discharge Instructions (Signed)
Zofran dissolved in mouth as needed for nausea and vomiting Focus on fluids and rehydration Continue to monitor symptoms and abdominal pain Follow-up in emergency room if developing worsening abdominal pain, dizziness or lightheadedness

## 2021-04-22 NOTE — ED Provider Notes (Signed)
[facility]    CSN: [id number] Arrival date & time: 04/22/21  1630      History   Chief Complaint Chief Complaint  Patient presents with  . Abdominal Pain    HPI Belinda Rhodes is a 60 y.o. female history of hypertension, prior hysterectomy, presenting today for evaluation of abdominal pain.  Reports mild generalized abdominal discomfort over the past 1 to 2 days.  Slight nausea, but denies vomiting.  Denies diarrhea or change in bowels.  Reports granddaughter with stomach virus, reports that she tested negative for strep COVID and flu.  She denies history of any GI problems.  Denies any associated URI symptoms.  Denies fevers chills or body aches.  Has had some fatigue.  HPI  Past Medical History:  Diagnosis Date  . Head injury   . Hypertension   . Hypothyroid     There are no problems to display for this patient.   Past Surgical History:  Procedure Laterality Date  . ABDOMINAL HYSTERECTOMY    . CESAREAN SECTION    . WISDOM TOOTH EXTRACTION      OB History   No obstetric history on file.      Home Medications    Prior to Admission medications   Medication Sig Start Date End Date Taking? Authorizing Provider  ondansetron (ZOFRAN ODT) 4 MG disintegrating tablet Take 1 tablet (4 mg total) by mouth every 8 (eight) hours as needed for nausea or vomiting. 04/22/21  Yes Sem Mccaughey C, PA-C  levothyroxine (SYNTHROID, LEVOTHROID) 50 MCG tablet Take 50 mcg by mouth daily before breakfast.    [provider]  nebivolol (BYSTOLIC) 10 MG tablet Take 10 mg by mouth daily.    [provider]  Olmesartan-Amlodipine-HCTZ 40-5-25 MG TABS Take 1 tablet by mouth daily.    [provider]  rosuvastatin (CRESTOR) 20 MG tablet Take 20 mg by mouth daily.    [provider]    Family History History reviewed. No pertinent family history.  Social History Social History   Tobacco Use  . Smoking status: Current Every Day Smoker     Packs/day: 0.50    Types: Cigarettes  . Smokeless tobacco: Never Used  Substance Use Topics  . Alcohol use: No  . Drug use: No     Allergies   Patient has no known allergies.   Review of Systems Review of Systems  Constitutional: Positive for fatigue. Negative for fever.  HENT: Negative for congestion and sore throat.   Respiratory: Negative for cough and shortness of breath.   Cardiovascular: Negative for chest pain.  Gastrointestinal: Positive for abdominal pain. Negative for diarrhea, nausea and vomiting.  Genitourinary: Negative for dysuria, flank pain, genital sores, hematuria, menstrual problem, vaginal bleeding, vaginal discharge and vaginal pain.  Musculoskeletal: Negative for back pain.  Skin: Negative for rash.  Neurological: Negative for dizziness, light-headedness and headaches.     Physical Exam Triage Vital Signs ED Triage Vitals  Enc Vitals Group     BP      Pulse      Resp      Temp      Temp src      SpO2      Weight      Height      Head Circumference      Peak Flow      Pain Score      Pain Loc      Pain Edu?      Excl.  in GC?    No data found.  Updated Vital Signs BP (!) 160/106 (BP Location: Left Arm)   Pulse 87   Temp 98.5 F (36.9 C) (Oral)   Resp 18   SpO2 98%   Visual Acuity Right Eye Distance:   Left Eye Distance:   Bilateral Distance:    Right Eye Near:   Left Eye Near:    Bilateral Near:     Physical Exam Vitals and nursing note reviewed.  Constitutional:      Appearance: She is well-developed.     Comments: No acute distress  HENT:     Head: Normocephalic and atraumatic.     Nose: Nose normal.     Mouth/Throat:     Comments: Oral mucosa pink and moist, no tonsillar enlargement or exudate. Posterior pharynx patent and nonerythematous, no uvula deviation or swelling. Normal phonation. Eyes:     Conjunctiva/sclera: Conjunctivae normal.  Cardiovascular:     Rate and Rhythm: Normal rate and regular rhythm.   Pulmonary:     Effort: Pulmonary effort is normal. No respiratory distress.     Comments: Breathing comfortably at rest, CTABL, no wheezing, rales or other adventitious sounds auscultated Abdominal:     General: There is no distension.     Comments: Soft, nondistended, nontender to light and deep palpation throughout abdomen  Musculoskeletal:        General: Normal range of motion.     Cervical back: Neck supple.  Skin:    General: Skin is warm and dry.  Neurological:     Mental Status: She is alert and oriented to person, place, and time.      UC Treatments / Results  Labs (all labs ordered are listed, but only abnormal results are displayed) Labs Reviewed - No data to display  EKG   Radiology No results found.  Procedures Procedures (including critical care time)  Medications Ordered in UC Medications - No data to display  Initial Impression / Assessment and Plan / UC Course  I have reviewed the triage vital signs and the nursing notes.  Pertinent labs & imaging results that were available during my care of the patient were reviewed by me and considered in my medical decision making (see chart for details).     Generalized abdominal discomfort with associated nausea, exposure to viral gastroenteritis, suspect likely etiology and recommending continued symptomatic and supportive care with close monitoring, oral rehydration.  Negative rebound/peritoneal signs, do not suspect abdominal emergency at this time.  Zofran as needed for nausea.  Discussed strict return precautions. Patient verbalized understanding and is agreeable with plan.  Final Clinical Impressions(s) / UC Diagnoses   Final diagnoses:  Nausea without vomiting  Generalized abdominal pain     Discharge Instructions     Zofran dissolved in mouth as needed for nausea and vomiting Focus on fluids and rehydration Continue to monitor symptoms and abdominal pain Follow-up in emergency room if  developing worsening abdominal pain, dizziness or lightheadedness     ED Prescriptions    Medication Sig Dispense Auth. Provider   ondansetron (ZOFRAN ODT) 4 MG disintegrating tablet Take 1 tablet (4 mg total) by mouth every 8 (eight) hours as needed for nausea or vomiting. 20 tablet Chablis Losh, [place] C, PA-C     PDMP not reviewed this encounter.   Lew Dawes, [place] 04/22/21 [phone]

## 2021-04-22 NOTE — ED Triage Notes (Signed)
Pt c/o lower abdominal discomfort with slight nausea off and on since Friday. States her granddaughter has the stomach bug. States will need a work note.

## 2023-03-11 ENCOUNTER — Emergency Department (HOSPITAL_COMMUNITY): Payer: Commercial Managed Care - HMO

## 2023-03-11 ENCOUNTER — Other Ambulatory Visit: Payer: Self-pay

## 2023-03-11 ENCOUNTER — Inpatient Hospital Stay (HOSPITAL_COMMUNITY)
Admission: EM | Admit: 2023-03-11 | Discharge: 2023-03-14 | DRG: 387 | Disposition: A | Discharge status: Home / Self Care | Payer: Commercial Managed Care - HMO | Attending: Internal Medicine | Admitting: Internal Medicine

## 2023-03-11 DIAGNOSIS — R5383 Other fatigue: Secondary | ICD-10-CM | POA: Diagnosis present

## 2023-03-11 DIAGNOSIS — Z86010 Personal history of colonic polyps: Secondary | ICD-10-CM | POA: Diagnosis not present

## 2023-03-11 DIAGNOSIS — E785 Hyperlipidemia, unspecified: Secondary | ICD-10-CM | POA: Diagnosis present

## 2023-03-11 DIAGNOSIS — R1032 Left lower quadrant pain: Secondary | ICD-10-CM | POA: Diagnosis not present

## 2023-03-11 DIAGNOSIS — F17210 Nicotine dependence, cigarettes, uncomplicated: Secondary | ICD-10-CM | POA: Diagnosis present

## 2023-03-11 DIAGNOSIS — M19071 Primary osteoarthritis, right ankle and foot: Secondary | ICD-10-CM | POA: Diagnosis present

## 2023-03-11 DIAGNOSIS — M79671 Pain in right foot: Secondary | ICD-10-CM | POA: Diagnosis present

## 2023-03-11 DIAGNOSIS — Z79890 Hormone replacement therapy (postmenopausal): Secondary | ICD-10-CM

## 2023-03-11 DIAGNOSIS — D126 Benign neoplasm of colon, unspecified: Secondary | ICD-10-CM | POA: Diagnosis not present

## 2023-03-11 DIAGNOSIS — K7689 Other specified diseases of liver: Secondary | ICD-10-CM | POA: Diagnosis present

## 2023-03-11 DIAGNOSIS — K5150 Left sided colitis without complications: Principal | ICD-10-CM | POA: Diagnosis present

## 2023-03-11 DIAGNOSIS — K5730 Diverticulosis of large intestine without perforation or abscess without bleeding: Secondary | ICD-10-CM | POA: Diagnosis present

## 2023-03-11 DIAGNOSIS — E039 Hypothyroidism, unspecified: Secondary | ICD-10-CM | POA: Diagnosis present

## 2023-03-11 DIAGNOSIS — Z90710 Acquired absence of both cervix and uterus: Secondary | ICD-10-CM | POA: Diagnosis not present

## 2023-03-11 DIAGNOSIS — K6389 Other specified diseases of intestine: Secondary | ICD-10-CM | POA: Diagnosis present

## 2023-03-11 DIAGNOSIS — Z79899 Other long term (current) drug therapy: Secondary | ICD-10-CM

## 2023-03-11 DIAGNOSIS — K529 Noninfective gastroenteritis and colitis, unspecified: Secondary | ICD-10-CM | POA: Diagnosis present

## 2023-03-11 DIAGNOSIS — K922 Gastrointestinal hemorrhage, unspecified: Secondary | ICD-10-CM | POA: Diagnosis present

## 2023-03-11 DIAGNOSIS — Z8711 Personal history of peptic ulcer disease: Secondary | ICD-10-CM | POA: Diagnosis not present

## 2023-03-11 DIAGNOSIS — K625 Hemorrhage of anus and rectum: Principal | ICD-10-CM

## 2023-03-11 DIAGNOSIS — D125 Benign neoplasm of sigmoid colon: Secondary | ICD-10-CM | POA: Diagnosis present

## 2023-03-11 DIAGNOSIS — I10 Essential (primary) hypertension: Secondary | ICD-10-CM | POA: Diagnosis present

## 2023-03-11 DIAGNOSIS — Z720 Tobacco use: Secondary | ICD-10-CM | POA: Diagnosis present

## 2023-03-11 LAB — COMPREHENSIVE METABOLIC PANEL
ALT: 14 U/L (ref 0–44)
AST: 20 U/L (ref 15–41)
Albumin: 3.7 g/dL (ref 3.5–5.0)
Alkaline Phosphatase: 53 U/L (ref 38–126)
Anion gap: 11 (ref 5–15)
BUN: 10 mg/dL (ref 8–23)
CO2: 23 mmol/L (ref 22–32)
Calcium: 9.1 mg/dL (ref 8.9–10.3)
Chloride: 102 mmol/L (ref 98–111)
Creatinine, Ser: 0.89 mg/dL (ref 0.44–1.00)
GFR, Estimated: >60 mL/min (ref >60)
Glucose, Bld: 95 mg/dL (ref 70–99)
Potassium: 3.4 mmol/L — ABNORMAL LOW (ref 3.5–5.1)
Sodium: 136 mmol/L (ref 135–145)
Total Bilirubin: 0.8 mg/dL (ref 0.3–1.2)
Total Protein: 7.2 g/dL (ref 6.5–8.1)

## 2023-03-11 LAB — CBC WITH DIFFERENTIAL/PLATELET
Abs Immature Granulocytes: 0.04 10*3/uL (ref 0.00–0.07)
Basophils Absolute: 0 10*3/uL (ref 0.0–0.1)
Basophils Relative: 0 %
Eosinophils Absolute: 0 10*3/uL (ref 0.0–0.5)
Eosinophils Relative: 0 %
HCT: 42.7 % (ref 36.0–46.0)
Hemoglobin: 13.8 g/dL (ref 12.0–15.0)
Immature Granulocytes: 0 %
Lymphocytes Relative: 26 %
Lymphs Abs: 2.4 10*3/uL (ref 0.7–4.0)
MCH: 30.2 pg (ref 26.0–34.0)
MCHC: 32.3 g/dL (ref 30.0–36.0)
MCV: 93.4 fL (ref 80.0–100.0)
Monocytes Absolute: 0.5 10*3/uL (ref 0.1–1.0)
Monocytes Relative: 5 %
Neutro Abs: 6.2 10*3/uL (ref 1.7–7.7)
Neutrophils Relative %: 69 %
Platelets: 224 10*3/uL (ref 150–400)
RBC: 4.57 MIL/uL (ref 3.87–5.11)
RDW: 13.9 % (ref 11.5–15.5)
WBC: 9.2 10*3/uL (ref 4.0–10.5)
nRBC: 0 % (ref 0.0–0.2)

## 2023-03-11 LAB — I-STAT CHEM 8, ED
BUN: 10 mg/dL (ref 8–23)
Calcium, Ion: 1.11 mmol/L — ABNORMAL LOW (ref 1.15–1.40)
Chloride: 104 mmol/L (ref 98–111)
Creatinine, Ser: 0.7 mg/dL (ref 0.44–1.00)
Glucose, Bld: 93 mg/dL (ref 70–99)
HCT: 43 % (ref 36.0–46.0)
Hemoglobin: 14.6 g/dL (ref 12.0–15.0)
Potassium: 3.4 mmol/L — ABNORMAL LOW (ref 3.5–5.1)
Sodium: 139 mmol/L (ref 135–145)
TCO2: 26 mmol/L (ref 22–32)

## 2023-03-11 LAB — POC OCCULT BLOOD, ED: Fecal Occult Bld: POSITIVE — AB

## 2023-03-11 MED ORDER — NICOTINE 7 MG/24HR TD PT24
7.0000 mg | MEDICATED_PATCH | Freq: Every day | TRANSDERMAL | Status: DC
  Administered 2023-03-12 – 2023-03-13 (×2): 7 mg via TRANSDERMAL
  Filled 2023-03-11 (×3): qty 1

## 2023-03-11 MED ORDER — IOHEXOL 350 MG/ML SOLN
75.0000 mL | Freq: Once | INTRAVENOUS | Status: AC | PRN
  Administered 2023-03-11: 75 mL via INTRAVENOUS

## 2023-03-11 MED ORDER — POTASSIUM CHLORIDE 10 MEQ/100ML IV SOLN
10.0000 meq | INTRAVENOUS | Status: AC
  Administered 2023-03-12 (×2): 10 meq via INTRAVENOUS
  Filled 2023-03-11 (×2): qty 100

## 2023-03-11 MED ORDER — CIPROFLOXACIN IN D5W 400 MG/200ML IV SOLN
400.0000 mg | Freq: Once | INTRAVENOUS | Status: AC
  Administered 2023-03-11: 400 mg via INTRAVENOUS
  Filled 2023-03-11: qty 200

## 2023-03-11 MED ORDER — METRONIDAZOLE 500 MG/100ML IV SOLN
500.0000 mg | Freq: Two times a day (BID) | INTRAVENOUS | Status: DC
  Administered 2023-03-11 – 2023-03-14 (×6): 500 mg via INTRAVENOUS
  Filled 2023-03-11 (×6): qty 100

## 2023-03-11 MED ORDER — PANTOPRAZOLE 80MG IVPB - SIMPLE MED
80.0000 mg | Freq: Once | INTRAVENOUS | Status: AC
  Administered 2023-03-11: 80 mg via INTRAVENOUS
  Filled 2023-03-11: qty 100

## 2023-03-11 MED ORDER — PANTOPRAZOLE SODIUM 40 MG IV SOLR: 40.0000 mg | Freq: Two times a day (BID) | INTRAVENOUS | Status: DC

## 2023-03-11 NOTE — ED Notes (Signed)
Pt ambulatory to bathroom

## 2023-03-11 NOTE — ED Provider Notes (Signed)
  Physical Exam  BP 128/67 (BP Location: Right Arm)   Pulse 73   Temp 98.9 F (37.2 C) (Oral)   Resp 18   Ht 5\' 6"  (1.676 m)   Wt 81.6 kg   SpO2 96%   BMI 29.05 kg/m   Physical Exam  Procedures  Procedures  ED Course / MDM   Clinical Course as of 03/11/23 2335  Wed Mar 11, 2023  1631 Assumed care from Dr. Tamera Punt.  62 year old female who presented with abdominal pain with bloody bowel movements. Gross blood on rectal exam. Awaiting CT scan and will likely admit. HGB and vitals stable. Not on thinners. Feel that she will benefit from admission.  Starting on Protonix at this time [RP]  2219 CT scan showed colitis.  No apparent source of bleed.  Started on ciprofloxacin and Flagyl in case of infectious colitis.  Admitted to medicine under Dr Roel Cluck.  [RP]  2232 Dr Lorenso Courier from [facility].  [RP]    Clinical Course User Index [RP] Fransico Meadow, MD   Medical Decision Making Amount and/or Complexity of Data Reviewed Labs: ordered. Radiology: ordered.  Risk Prescription drug management. Decision regarding hospitalization.     Fransico Meadow, MD 03/11/23 [phone]

## 2023-03-11 NOTE — Assessment & Plan Note (Signed)
Continue Lipitor 40 mg p.o. daily 

## 2023-03-11 NOTE — Assessment & Plan Note (Signed)
Allow permissive hypertension for tonight 

## 2023-03-11 NOTE — Assessment & Plan Note (Signed)
Continue Synthroid 50 mcg p.o.  /day check TSH

## 2023-03-11 NOTE — ED Notes (Signed)
RN called lab 3 times to run labs. Lab states they will run labs now.

## 2023-03-11 NOTE — H&P (Signed)
Belinda Rhodes [id number] DOB: [birth year] DOA: 03/11/2023     PCP: Charolette Forward, MD   Outpatient Specialists:    GI pt unsure who was her doctor    Patient arrived to ER on 03/11/23 at 1106 Referred by Attending Fransico Meadow, MD   Patient coming from:    home Lives  With family    Chief Complaint:   Chief Complaint  Patient presents with   Abdominal Pain   Rectal Bleeding    HPI: Belinda Rhodes is a 62 y.o. female with medical history significant of HTN, HLD hypothyroidism    Presented with   bright red blood per rectum Patient presented for blood in stools that started yesterday now with blood clots also complaining of abdominal pain reports gurgling in her stomach she had 2 large bowel movements which were mainly blood.  No associated nausea vomiting has been feeling lightheaded and weak but no shortness of breath she never had any prior history of similar to this.  Not on blood thinners    States she have had a  colonoscopy few years ago  No prior history of GI bleeding She has history of anemia in the past requiring blood transfusions secondary to vaginal bleeding now status post hysterectomy. Patient states that she did eat some food which was brought in few days ago there was no foul smell to the food but it was somewhat unusual Other people ate the food and did not have any symptoms Patient denies any fevers or chills but her symptoms started few hours after eating food Last episode of GI bleeding was this morning   Still smokes 3 cigarettes a day does not drink  Regarding pertinent Chronic problems:     Hyperlipidemia - on statins Crestor     HTN on Bystolic olmesartan/amlodipine hydrochlorothiazide    hypothyroidism:  on synthroid     While in ER: Clinical Course as of 03/11/23 2238  Wed Mar 11, 2023  1631 Abdominal pain with bloody bowel movements. Gross blood on rectal exam. Awaiting CT scan and will likely admit. HGB and vitals  stable. Not on thinners. Feel that she will benefit from admission.  [RP]  2219 Dr Roel Cluck [RP]  2232 Dr Lorenso Courier from [facility].  [RP]    Clinical Course User Index [RP] Fransico Meadow, MD       Lab Orders         Comprehensive metabolic panel         CBC with Differential         I-stat chem 8, ED         POC occult blood, ED     Hemoccult positive    CTabd/pelvis - Diffuse wall thickening of the left colon, from the left transverse colon through the proximal sigmoid, consistent with an infectious or inflammatory colitis.   Given findings consistent with colitis ER started on Cipro Flagyl antibiotics  Following Medications were ordered in ER: Medications  pantoprazole (PROTONIX) injection 40 mg (has no administration in time range)  metroNIDAZOLE (FLAGYL) IVPB 500 mg (0 mg Intravenous Stopped 03/11/23 1917)  pantoprazole (PROTONIX) 80 mg /NS 100 mL IVPB (0 mg Intravenous Stopped 03/11/23 1746)  iohexol (OMNIPAQUE) 350 MG/ML injection 75 mL (75 mLs Intravenous Contrast Given 03/11/23 1706)  ciprofloxacin (CIPRO) IVPB 400 mg (0 mg Intravenous Stopped 03/11/23 1917)    _______________________________________________________ ER Provider Called:  [facility]   Dr.Dorsey  They Recommend admit to medicine   Will  see in AM      ED Triage Vitals  Enc Vitals Group     BP 03/11/23 1120 (!) 145/84     Pulse Rate 03/11/23 1120 87     Resp 03/11/23 1124 16     Temp 03/11/23 1123 98.1 F (36.7 C)     Temp Source 03/11/23 1123 Oral     SpO2 03/11/23 1120 100 %     Weight 03/11/23 1122 180 lb (81.6 kg)     Height 03/11/23 1122 5\' 6"  (1.676 m)     Head Circumference --      Peak Flow --      Pain Score 03/11/23 1122 0     Pain Loc --      Pain Edu? --      Excl. in [place]? --   TMAX(24)@     _________________________________________ Significant initial  Findings: Abnormal Labs Reviewed  COMPREHENSIVE METABOLIC PANEL - Abnormal; Notable for the following components:      Result Value    Potassium 3.4 (*)    All other components within normal limits  I-STAT CHEM 8, ED - Abnormal; Notable for the following components:   Potassium 3.4 (*)    Calcium, Ion 1.11 (*)    All other components within normal limits  POC OCCULT BLOOD, ED - Abnormal; Notable for the following components:   Fecal Occult Bld POSITIVE (*)    All other components within normal limits     ECG: Ordered    The recent clinical data is shown below. Vitals:   03/11/23 1705 03/11/23 1917 03/11/23 1917 03/11/23 2000  BP:   139/88 134/87  Pulse:   75 71  Resp:   18   Temp: 98.8 F (37.1 C) 98.9 F (37.2 C)    TempSrc: Oral Oral    SpO2:   100% 99%  Weight:      Height:        WBC     Component Value Date/Time   WBC 9.2 03/11/2023 1145   LYMPHSABS 2.4 03/11/2023 1145   MONOABS 0.5 03/11/2023 1145   EOSABS 0.0 03/11/2023 1145   BASOSABS 0.0 03/11/2023 1145       __________________________________________________________ Recent Labs  Lab 03/11/23 1145 03/11/23 1252  NA 136 139  K 3.4* 3.4*  CO2 23  --   GLUCOSE 95 93  BUN 10 10  CREATININE 0.89 0.70  CALCIUM 9.1  --     Cr  stable,   Lab Results  Component Value Date   CREATININE 0.70 03/11/2023   CREATININE 0.89 03/11/2023   CREATININE 0.98 01/07/2015    Recent Labs  Lab 03/11/23 1145  AST 20  ALT 14  ALKPHOS 53  BILITOT 0.8  PROT 7.2  ALBUMIN 3.7   Lab Results  Component Value Date   CALCIUM 9.1 03/11/2023    Plt: Lab Results  Component Value Date   PLT 224 03/11/2023       Recent Labs  Lab 03/11/23 1145 03/11/23 1252  WBC 9.2  --   NEUTROABS 6.2  --   HGB 13.8 14.6  HCT 42.7 43.0  MCV 93.4  --   PLT 224  --     HG/HCT  stable,      Component Value Date/Time   HGB 14.6 03/11/2023 1252   HCT 43.0 03/11/2023 1252   MCV 93.4 03/11/2023 1145    _______________________________________________ Hospitalist was called for admission for lower gi bleed  Colitis    The  following Work up has been  ordered so far:  Orders Placed This Encounter  Procedures   CT Abdomen Pelvis W Contrast   Comprehensive metabolic panel   CBC with Differential   Consult for Unassigned Medical Admission   I-stat chem 8, ED   POC occult blood, ED     OTHER Significant initial  Findings:  labs showing:     DM  labs:  HbA1C: No results for input(s): "HGBA1C" in the last 8760 hours.     CBG (last 3)  No results for input(s): "GLUCAP" in the last 72 hours.        Cultures: No results found for: "SDES", "SPECREQUEST", "CULT", "REPTSTATUS"   Radiological Exams on Admission: CT Abdomen Pelvis W Contrast  Result Date: 03/11/2023 CLINICAL DATA:  Left lower quadrant pain. Bloody stools beginning yesterday. EXAM: CT ABDOMEN AND PELVIS WITH CONTRAST TECHNIQUE: Multidetector CT imaging of the abdomen and pelvis was performed using the standard protocol following bolus administration of intravenous contrast. RADIATION DOSE REDUCTION: This exam was performed according to the departmental dose-optimization program which includes automated exposure control, adjustment of the mA and/or kV according to patient size and/or use of iterative reconstruction technique. CONTRAST:  60mL OMNIPAQUE IOHEXOL 350 MG/ML SOLN COMPARISON:  05/16/2004. FINDINGS: Lower chest: Clear lung bases. Hepatobiliary: Liver normal in size and overall attenuation. 3 low-attenuation liver lesions, largest posterior aspect of segment 3, 2.3 cm, next largest dome of the right lobe, segment 7, 7 mm. Tiny lesion noted in segment 4 B. These are consistent with cysts. No other liver abnormality. Normal gallbladder. No bile duct dilation. Pancreas: Unremarkable. No pancreatic ductal dilatation or surrounding inflammatory changes. Spleen: Normal in size without focal abnormality. Adrenals/Urinary Tract: Adrenal glands are unremarkable. Kidneys are normal, without renal calculi, focal lesion, or hydronephrosis. Bladder is unremarkable. Stomach/Bowel:  Diffuse wall thickening of the left colon beginning at the left transverse colon extending across the splenic flexure and throughout the descending portion into the proximal sigmoid colon. No significant adjacent inflammatory changes. There are multiple diverticula of the sigmoid colon without evidence of diverticulitis. Normal stomach. Small bowel is normal in caliber. No wall thickening or inflammation. No evidence of appendicitis. Vascular/Lymphatic: Minor aortic atherosclerosis. No aneurysm. Prominent to mildly enlarged gastrohepatic ligament lymph nodes, largest 1.3 cm in short axis. No other enlarged lymph nodes. Reproductive: Status post hysterectomy. No adnexal masses. Other: No abdominal wall hernia or abnormality. No abdominopelvic ascites. Musculoskeletal: No fracture or acute finding.  No bone lesion. IMPRESSION: 1. Diffuse wall thickening of the left colon, from the left transverse colon through the proximal sigmoid, consistent with an infectious or inflammatory colitis. 2. Sigmoid colon diverticula, but no evidence of diverticulitis. 3. No other acute abnormality within the abdomen or pelvis. 4. Minor aortic atherosclerosis. Electronically Signed   By: Lajean Manes M.D.   On: 03/11/2023 17:15   _______________________________________________________________________________________________________ Latest  Blood pressure 134/87, pulse 71, temperature 98.9 F (37.2 C), temperature source Oral, resp. rate 18, height 5\' 6"  (1.676 m), weight 81.6 kg, SpO2 99 %.   Vitals  labs and radiology finding personally reviewed  Review of Systems:    Pertinent positives include:  , fatigue,  abdominal pain,  blood in stool, Constitutional:  No weight loss, night sweats, Fevers, chills weight loss  HEENT:  No headaches, Difficulty swallowing,Tooth/dental problems,Sore throat,  No sneezing, itching, ear ache, nasal congestion, post nasal drip,  Cardio-vascular:  No chest pain, Orthopnea, PND, anasarca,  dizziness, palpitations.no Bilateral lower extremity swelling  GI:  No heartburn, indigestion,nausea, vomiting, diarrhea, change in bowel habits, loss of appetite, melena, hematemesis Resp:  no shortness of breath at rest. No dyspnea on exertion, No excess mucus, no productive cough, No non-productive cough, No coughing up of blood.No change in color of mucus.No wheezing. Skin:  no rash or lesions. No jaundice GU:  no dysuria, change in color of urine, no urgency or frequency. No straining to urinate.  No flank pain.  Musculoskeletal:  No joint pain or no joint swelling. No decreased range of motion. No back pain.  Psych:  No change in mood or affect. No depression or anxiety. No memory loss.  Neuro: no localizing neurological complaints, no tingling, no weakness, no double vision, no gait abnormality, no slurred speech, no confusion  All systems reviewed and apart from [place] all are negative _______________________________________________________________________________________________ Past Medical History:   Past Medical History:  Diagnosis Date   Head injury    Hypertension    Hypothyroid       Past Surgical History:  Procedure Laterality Date   ABDOMINAL HYSTERECTOMY     CESAREAN SECTION     WISDOM TOOTH EXTRACTION      Social History:  Ambulatory   independently      reports that she has been smoking cigarettes. She has been smoking an average of .5 packs per day. She has never used smokeless tobacco. She reports that she does not drink alcohol and does not use drugs.     Family History: No history of inflammatory bowel disease ______________________________________________________________________________________________ Allergies: No Known Allergies   Prior to Admission medications   Medication Sig Start Date End Date Taking? Authorizing Provider  amLODipine (NORVASC) 5 MG tablet Take 5 mg by mouth daily.   Yes [provider]   Aspirin-Acetaminophen-Caffeine (GOODY HEADACHE PO) Take 1 packet by mouth every 8 (eight) hours as needed (pain, headache).   Yes [provider]  atorvastatin (LIPITOR) 40 MG tablet Take 40 mg by mouth daily.   Yes [provider]  ibuprofen (ADVIL) 200 MG tablet Take 600 mg by mouth daily as needed (pain).   Yes [provider]  levothyroxine (SYNTHROID, LEVOTHROID) 50 MCG tablet Take 50 mcg by mouth daily before breakfast.   Yes [provider]  losartan (COZAAR) 100 MG tablet Take 100 mg by mouth daily.   Yes [provider]    ___________________________________________________________________________________________________ Physical Exam:    03/11/2023    8:00 PM 03/11/2023    7:17 PM 03/11/2023    4:15 PM  Vitals with BMI  Systolic [id number]  Diastolic 87 88 87  Pulse 71 75 70     1. General:  in No  Acute distress    Chronically ill   -appearing 2. Psychological: Alert and   Oriented 3. Head/ENT:    Dry Mucous Membranes                          Head Non traumatic, neck supple                           Poor Dentition 4. SKIN:  decreased Skin turgor,    5. Heart: Regular rate and rhythm no  Murmur, no Rub or gallop 6. Lung  no wheezes or crackles   7. Abdomen: Soft,  non-tender, Non distended   obese  bowel sounds present 8. Lower extremities: no clubbing, cyanosis, no  edema 9. Neurologically Grossly  intact, moving all 4 extremities equally   10. MSK: Normal range of motion    Chart has been reviewed  ______________________________________________________________________________________________  Assessment/Plan  62 y.o. female with medical history significant of HTN, HLD hypothyroidism  Admitted for lower gi bleed and colitis     Present on Admission:  Lower GI bleed  Colitis  Essential hypertension  Hyperlipidemia  Hypothyroidism  Tobacco abuse     Lower GI bleed - Suspect Lower Gi source  No hx of PUD,  melena,   to  suggest otherwise  - Admit  For further management given:  Age >60 years,  gross rectal bleeding,  rebleeding    -  most likely   colitis is the source       -  ER  Provider spoke to gastroenterology ( [facility]) they will see patient in a.m. appreciate their consult   - serial CBC.    - Monitor for any recurrence,  evidence of hemodynamic instability or significant blood loss -  type and screen,  - Transfuse as needed for hemoglobin below 7 or <9 if evidence of significant  bleeding  - Establish at least 2 PIV and fluid resuscitate   - clear liquids for tonight keep nothing by mouth post midnight,  -  monitor for Recurrent significant  Bleeding of red blood and hemodynamic instability    Colitis Bowel rest will continue Cipro Flagyl  Essential hypertension Allow permissive hypertension for tonight  Hyperlipidemia Continue Lipitor 40 mg p.o. daily  Hypothyroidism Continue Synthroid 50 mcg p.o.  /day check TSH  Tobacco abuse  - Spoke about importance of quitting spent 5 minutes discussing options for treatment, prior attempts at quitting, and dangers of smoking  -At this point patient is    interested in quitting  - order nicotine patch   - nursing tobacco cessation protocol   Other plan as per orders.  DVT prophylaxis:  SCD      Code Status:    Code Status: Not on file FULL CODE   as per patient   I had personally discussed CODE STATUS with patient and family  ACP none   Family Communication:   Family   at  Bedside  plan of care was discussed   with   Son,    Disposition Plan:         To home once workup is complete and patient is stable   Following barriers for discharge:                            Electrolytes corrected                               Anemia corrected                                                         Will need consultants to evaluate patient prior to discharge    Consults called:  [facility] is aware    Admission status:  ED  Disposition     ED Disposition  [place]: [facility] [[id number]]  Level of Care: Telemetry Medical [[id number]]  May admit patient to  [facility] or [facility] if equivalent level of care is available:: No  Covid Evaluation: Asymptomatic - no recent exposure (last 10 days) testing not required  Diagnosis: Lower GI bleed [id number]  Admitting Physician: Toy Baker [[id number]]  Attending Physician: Toy Baker [id number]  Certification:: I certify this patient will need inpatient services for at least 2 midnights  Estimated Length of Stay: 2          inpatient     I Expect 2 midnight stay secondary to severity of patient's current illness need for inpatient interventions justified by the following:     Severe lab/radiological/exam abnormalities including:   Colitis and lower GI bleed    That are currently affecting medical management.   I expect  patient to be hospitalized for 2 midnights requiring inpatient medical care.  Patient is at high risk for adverse outcome (such as loss of life or disability) if not treated.  Indication for inpatient stay as follows:    inability to maintain oral hydration     Need for operative/procedural  intervention    Need for IV antibiotics, IV fluids,     Level of care     tele  For 12H     Markelle Asaro 03/11/2023, 11:28 PM    [facility]     after 2 AM please page floor coverage PA If 7AM-7PM, please contact the day team taking care of the patient using [email or web address]

## 2023-03-11 NOTE — ED Notes (Signed)
Patient transported to CT 

## 2023-03-11 NOTE — ED Triage Notes (Signed)
Pt came to ED for blood stools that started yesterday, formed stool. Pt states she had blood clots coming out. Pt c/o abd pain.  Axox4. VSS.

## 2023-03-11 NOTE — Assessment & Plan Note (Signed)
Bowel rest will continue Cipro Flagyl

## 2023-03-11 NOTE — Assessment & Plan Note (Signed)
-   Spoke about importance of quitting spent 5 minutes discussing options for treatment, prior attempts at quitting, and dangers of smoking  -At this point patient is     interested in quitting  - order nicotine patch   - nursing tobacco cessation protocol  

## 2023-03-11 NOTE — ED Provider Notes (Signed)
[facility] Provider Note   CSN: [id number] Arrival date & time: 03/11/23  1106     History  Chief Complaint  Patient presents with   Abdominal Pain   Rectal Bleeding    JECENIA SEAVEY is a 62 y.o. female.  Patient is a 62 year old female who presents with bloody stools.  She said that yesterday she had some "gurgling" in her stomach.  She said it bothered her through most of the afternoon and evening.  This morning it felt a little bit better but she had 2 large bowel movements that were pretty much all blood.  She had some blood clots.  She has not had any nausea or vomiting.  She is felt a little fatigued recently.  No shortness of breath.  No prior history of abdominal issues.  No recent change in her stools.  She is not on anticoagulants.       Home Medications Prior to Admission medications   Medication Sig Start Date End Date Taking? Authorizing Provider  levothyroxine (SYNTHROID, LEVOTHROID) 50 MCG tablet Take 50 mcg by mouth daily before breakfast.    [provider]  nebivolol (BYSTOLIC) 10 MG tablet Take 10 mg by mouth daily.    [provider]  Olmesartan-Amlodipine-HCTZ 40-5-25 MG TABS Take 1 tablet by mouth daily.    [provider]  ondansetron (ZOFRAN ODT) 4 MG disintegrating tablet Take 1 tablet (4 mg total) by mouth every 8 (eight) hours as needed for nausea or vomiting. 04/22/21   Wieters, Hallie C, PA-C  rosuvastatin (CRESTOR) 20 MG tablet Take 20 mg by mouth daily.    [provider]      Allergies    Patient has no known allergies.    Review of Systems   Review of Systems  Constitutional:  Negative for chills, diaphoresis, fatigue and fever.  HENT:  Negative for congestion, rhinorrhea and sneezing.   Eyes: Negative.   Respiratory:  Negative for cough, chest tightness and shortness of breath.   Cardiovascular:  Negative for chest pain and leg swelling.  Gastrointestinal:   Positive for abdominal pain and blood in stool. Negative for diarrhea, nausea and vomiting.  Genitourinary:  Negative for difficulty urinating, flank pain, frequency and hematuria.  Musculoskeletal:  Negative for arthralgias and back pain.  Skin:  Negative for rash.  Neurological:  Negative for dizziness, speech difficulty, weakness, numbness and headaches.    Physical Exam Updated Vital Signs BP 131/80   Pulse 89   Temp 98.6 F (37 C) (Oral)   Resp 18   Ht 5\' 6"  (1.676 m)   Wt 81.6 kg   SpO2 99%   BMI 29.05 kg/m  Physical Exam Constitutional:      Appearance: She is well-developed.  HENT:     Head: Normocephalic and atraumatic.  Eyes:     Pupils: Pupils are equal, round, and reactive to light.  Cardiovascular:     Rate and Rhythm: Normal rate and regular rhythm.     Heart sounds: Normal heart sounds.  Pulmonary:     Effort: Pulmonary effort is normal. No respiratory distress.     Breath sounds: Normal breath sounds. No wheezing or rales.  Chest:     Chest wall: No tenderness.  Abdominal:     General: Bowel sounds are normal.     Palpations: Abdomen is soft.     Tenderness: There is abdominal tenderness in the left lower quadrant. There is no guarding or  rebound.  Genitourinary:    Comments: Rectal exam shows small amount of gross blood Musculoskeletal:        General: Normal range of motion.     Cervical back: Normal range of motion and neck supple.  Lymphadenopathy:     Cervical: No cervical adenopathy.  Skin:    General: Skin is warm and dry.     Findings: No rash.  Neurological:     Mental Status: She is alert and oriented to person, place, and time.     ED Results / Procedures / Treatments   Labs (all labs ordered are listed, but only abnormal results are displayed) Labs Reviewed  I-STAT CHEM 8, ED - Abnormal; Notable for the following components:      Result Value   Potassium 3.4 (*)    Calcium, Ion 1.11 (*)    All other components within normal  limits  POC OCCULT BLOOD, ED - Abnormal; Notable for the following components:   Fecal Occult Bld POSITIVE (*)    All other components within normal limits  CBC WITH DIFFERENTIAL/PLATELET  COMPREHENSIVE METABOLIC PANEL    EKG None  Radiology No results found.  Procedures Procedures    Medications Ordered in ED Medications - No data to display  ED Course/ Medical Decision Making/ A&P                             Medical Decision Making Amount and/or Complexity of Data Reviewed Labs: ordered. Radiology: ordered.   Patient is a 62 year old who presents with abdominal pain associate with rectal bleeding.  She did have some gross blood on rectal exam.  Her hemoglobin is stable.  Her vital signs are stable.  She is awaiting CT of her chest.  Patient care turned over to Dr. Philip Aspen pending this evaluation.  Final Clinical Impression(s) / ED Diagnoses Final diagnoses:  Rectal bleeding  Left lower quadrant abdominal pain    Rx / DC Orders ED Discharge Orders     None         Malvin Johns, MD 03/11/23 1546

## 2023-03-11 NOTE — Assessment & Plan Note (Signed)
-   Suspect Lower Gi source  No hx of PUD, melena,   to  suggest otherwise  - Admit  For further management given:  Age >60 years,  gross rectal bleeding,  rebleeding    -  most likely   colitis is the source       -  ER  Provider spoke to gastroenterology ( [facility]) they will see patient in a.m. appreciate their consult   - serial CBC.    - Monitor for any recurrence,  evidence of hemodynamic instability or significant blood loss -  type and screen,  - Transfuse as needed for hemoglobin below 7 or <9 if evidence of significant  bleeding  - Establish at least 2 PIV and fluid resuscitate   - clear liquids for tonight keep nothing by mouth post midnight,  -  monitor for Recurrent significant  Bleeding of red blood and hemodynamic instability

## 2023-03-11 NOTE — Subjective & Objective (Signed)
Patient presented for blood in stools that started yesterday now with blood clots also complaining of abdominal pain reports gurgling in her stomach she had 2 large bowel movements which were mainly blood.  No associated nausea vomiting has been feeling lightheaded and weak but no shortness of breath she never had any prior history of similar to this.  Not on blood thinners

## 2023-03-12 DIAGNOSIS — K922 Gastrointestinal hemorrhage, unspecified: Secondary | ICD-10-CM | POA: Diagnosis not present

## 2023-03-12 LAB — CBC
HCT: 36.9 % (ref 36.0–46.0)
HCT: 38.6 % (ref 36.0–46.0)
HCT: 37.4 % (ref 36.0–46.0)
HCT: 36.2 % (ref 36.0–46.0)
HCT: 35.7 % — ABNORMAL LOW (ref 36.0–46.0)
Hemoglobin: 11.9 g/dL — ABNORMAL LOW (ref 12.0–15.0)
Hemoglobin: 12.4 g/dL (ref 12.0–15.0)
Hemoglobin: 12.4 g/dL (ref 12.0–15.0)
Hemoglobin: 11.6 g/dL — ABNORMAL LOW (ref 12.0–15.0)
Hemoglobin: 11.8 g/dL — ABNORMAL LOW (ref 12.0–15.0)
MCH: 30.4 pg (ref 26.0–34.0)
MCH: 29.8 pg (ref 26.0–34.0)
MCH: 30.8 pg (ref 26.0–34.0)
MCH: 30 pg (ref 26.0–34.0)
MCH: 30.4 pg (ref 26.0–34.0)
MCHC: 32.2 g/dL (ref 30.0–36.0)
MCHC: 32.1 g/dL (ref 30.0–36.0)
MCHC: 33.2 g/dL (ref 30.0–36.0)
MCHC: 32 g/dL (ref 30.0–36.0)
MCHC: 33.1 g/dL (ref 30.0–36.0)
MCV: 94.1 fL (ref 80.0–100.0)
MCV: 92.8 fL (ref 80.0–100.0)
MCV: 92.8 fL (ref 80.0–100.0)
MCV: 93.5 fL (ref 80.0–100.0)
MCV: 92 fL (ref 80.0–100.0)
Platelets: 198 10*3/uL (ref 150–400)
Platelets: 197 10*3/uL (ref 150–400)
Platelets: 188 10*3/uL (ref 150–400)
Platelets: 194 10*3/uL (ref 150–400)
Platelets: 176 10*3/uL (ref 150–400)
RBC: 3.92 MIL/uL (ref 3.87–5.11)
RBC: 4.16 MIL/uL (ref 3.87–5.11)
RBC: 4.03 MIL/uL (ref 3.87–5.11)
RBC: 3.87 MIL/uL (ref 3.87–5.11)
RBC: 3.88 MIL/uL (ref 3.87–5.11)
RDW: 14 % (ref 11.5–15.5)
RDW: 14.1 % (ref 11.5–15.5)
RDW: 14 % (ref 11.5–15.5)
RDW: 14 % (ref 11.5–15.5)
RDW: 13.9 % (ref 11.5–15.5)
WBC: 7.5 10*3/uL (ref 4.0–10.5)
WBC: 7.6 10*3/uL (ref 4.0–10.5)
WBC: 7.1 10*3/uL (ref 4.0–10.5)
WBC: 7.9 10*3/uL (ref 4.0–10.5)
WBC: 8.4 10*3/uL (ref 4.0–10.5)
nRBC: 0 % (ref 0.0–0.2)
nRBC: 0 % (ref 0.0–0.2)
nRBC: 0 % (ref 0.0–0.2)
nRBC: 0 % (ref 0.0–0.2)
nRBC: 0 % (ref 0.0–0.2)

## 2023-03-12 LAB — COMPREHENSIVE METABOLIC PANEL
ALT: 16 U/L (ref 0–44)
AST: 17 U/L (ref 15–41)
Albumin: 3.1 g/dL — ABNORMAL LOW (ref 3.5–5.0)
Alkaline Phosphatase: 42 U/L (ref 38–126)
Anion gap: 10 (ref 5–15)
BUN: 13 mg/dL (ref 8–23)
CO2: 22 mmol/L (ref 22–32)
Calcium: 8.7 mg/dL — ABNORMAL LOW (ref 8.9–10.3)
Chloride: 106 mmol/L (ref 98–111)
Creatinine, Ser: 0.82 mg/dL (ref 0.44–1.00)
GFR, Estimated: >60 mL/min (ref >60)
Glucose, Bld: 79 mg/dL (ref 70–99)
Potassium: 3.6 mmol/L (ref 3.5–5.1)
Sodium: 138 mmol/L (ref 135–145)
Total Bilirubin: 0.8 mg/dL (ref 0.3–1.2)
Total Protein: 6 g/dL — ABNORMAL LOW (ref 6.5–8.1)

## 2023-03-12 LAB — HIV ANTIBODY (ROUTINE TESTING W REFLEX): HIV Screen 4th Generation wRfx: NONREACTIVE

## 2023-03-12 LAB — MRSA NEXT GEN BY PCR, NASAL: MRSA by PCR Next Gen: NOT DETECTED

## 2023-03-12 LAB — C-REACTIVE PROTEIN: CRP: 0.7 mg/dL (ref <1.0)

## 2023-03-12 LAB — MAGNESIUM
Magnesium: 2 mg/dL (ref 1.7–2.4)
Magnesium: 2 mg/dL (ref 1.7–2.4)

## 2023-03-12 LAB — TSH: TSH: 20.263 u[IU]/mL — ABNORMAL HIGH (ref 0.350–4.500)

## 2023-03-12 LAB — PHOSPHORUS
Phosphorus: 3.5 mg/dL (ref 2.5–4.6)
Phosphorus: 3.6 mg/dL (ref 2.5–4.6)

## 2023-03-12 LAB — ABO/RH: ABO/RH(D): O POS

## 2023-03-12 LAB — PREALBUMIN: Prealbumin: 22 mg/dL (ref 18–38)

## 2023-03-12 LAB — LACTIC ACID, PLASMA
Lactic Acid, Venous: 0.9 mmol/L (ref 0.5–1.9)
Lactic Acid, Venous: 0.7 mmol/L (ref 0.5–1.9)

## 2023-03-12 LAB — SEDIMENTATION RATE: Sed Rate: 19 mm/hr (ref 0–22)

## 2023-03-12 MED ORDER — ONDANSETRON HCL 4 MG PO TABS: 4.0000 mg | ORAL_TABLET | Freq: Four times a day (QID) | ORAL | Status: DC | PRN

## 2023-03-12 MED ORDER — ATORVASTATIN CALCIUM 40 MG PO TABS
40.0000 mg | ORAL_TABLET | Freq: Every day | ORAL | Status: DC
  Administered 2023-03-12 – 2023-03-14 (×3): 40 mg via ORAL
  Filled 2023-03-12 (×3): qty 1

## 2023-03-12 MED ORDER — ONDANSETRON HCL 4 MG/2ML IJ SOLN
4.0000 mg | Freq: Four times a day (QID) | INTRAMUSCULAR | Status: DC | PRN
  Administered 2023-03-13 (×3): 4 mg via INTRAVENOUS
  Filled 2023-03-12 (×3): qty 2

## 2023-03-12 MED ORDER — CIPROFLOXACIN IN D5W 400 MG/200ML IV SOLN
400.0000 mg | Freq: Two times a day (BID) | INTRAVENOUS | Status: DC
  Administered 2023-03-12 – 2023-03-14 (×5): 400 mg via INTRAVENOUS
  Filled 2023-03-12 (×6): qty 200

## 2023-03-12 MED ORDER — ACETAMINOPHEN 650 MG RE SUPP: 650.0000 mg | Freq: Four times a day (QID) | RECTAL | Status: DC | PRN

## 2023-03-12 MED ORDER — ACETAMINOPHEN 325 MG PO TABS: 650.0000 mg | ORAL_TABLET | Freq: Four times a day (QID) | ORAL | Status: DC | PRN

## 2023-03-12 MED ORDER — SODIUM CHLORIDE 0.9 % IV SOLN: INTRAVENOUS | Status: AC

## 2023-03-12 MED ORDER — LEVOTHYROXINE SODIUM 50 MCG PO TABS
50.0000 ug | ORAL_TABLET | Freq: Every day | ORAL | Status: DC
  Administered 2023-03-12 – 2023-03-14 (×3): 50 ug via ORAL
  Filled 2023-03-12 (×3): qty 1

## 2023-03-12 MED ORDER — PANTOPRAZOLE SODIUM 40 MG IV SOLR
40.0000 mg | Freq: Two times a day (BID) | INTRAVENOUS | Status: DC
  Administered 2023-03-12 – 2023-03-14 (×5): 40 mg via INTRAVENOUS
  Filled 2023-03-12 (×5): qty 10

## 2023-03-12 MED ORDER — HYDROCODONE-ACETAMINOPHEN 5-325 MG PO TABS
1.0000 | ORAL_TABLET | ORAL | Status: DC | PRN
  Administered 2023-03-12 (×2): 1 via ORAL
  Administered 2023-03-13 – 2023-03-14 (×7): 2 via ORAL
  Filled 2023-03-12 (×5): qty 2
  Filled 2023-03-12: qty 1
  Filled 2023-03-12: qty 2
  Filled 2023-03-12: qty 1
  Filled 2023-03-12: qty 2

## 2023-03-12 NOTE — ED Notes (Signed)
ED TO INPATIENT HANDOFF REPORT  ED Nurse Name and Phone #: Bonna Gains [phone]  S Name/Age/Gender Belinda Rhodes 62 y.o. female Room/Bed: RESUSC/RESUSC  Code Status   Code Status: Full Code  Home/SNF/Other Home Patient oriented to: self, place, time, and situation Is this baseline? Yes   Triage Complete: Triage complete  Chief Complaint Lower GI bleed [K92.2]  Triage Note Pt came to ED for blood stools that started yesterday, formed stool. Pt states she had blood clots coming out. Pt c/o abd pain.  Axox4. VSS.    Allergies No Known Allergies  Level of Care/Admitting Diagnosis ED Disposition     ED Disposition  Admit   Condition  --   Comment  Hospital Area: [facility] [[id number]]  Level of Care: Telemetry Medical [[id number]]  May admit patient to [facility] or [facility] if equivalent level of care is available:: No  Covid Evaluation: Asymptomatic - no recent exposure (last 10 days) testing not required  Diagnosis: Lower GI bleed [id number]  Admitting Physician: Toy Baker [id number]  Attending Physician: Toy Baker [id number]  Certification:: I certify this patient will need inpatient services for at least 2 midnights  Estimated Length of Stay: 2          B Medical/Surgery History Past Medical History:  Diagnosis Date   Head injury    Hypertension    Hypothyroid    Past Surgical History:  Procedure Laterality Date   ABDOMINAL HYSTERECTOMY     CESAREAN SECTION     WISDOM TOOTH EXTRACTION       A IV Location/Drains/Wounds Patient Lines/Drains/Airways Status     Active Line/Drains/Airways     Name Placement date Placement time Site Days   Peripheral IV 03/11/23 20 G Left Antecubital 03/11/23  1147  Antecubital  1            Intake/Output Last 24 hours No intake or output data in the 24 hours ending 03/12/23 0004  Labs/Imaging Results for orders placed or performed during the hospital encounter of 03/11/23 (from  the past 48 hour(s))  Comprehensive metabolic panel     Status: Abnormal   Collection Time: 03/11/23 11:45 AM  Result Value Ref Range   Sodium 136 135 - 145 mmol/L   Potassium 3.4 (L) 3.5 - 5.1 mmol/L   Chloride 102 98 - 111 mmol/L   CO2 23 22 - 32 mmol/L   Glucose, Bld 95 70 - 99 mg/dL    Comment: Glucose reference range applies only to samples taken after fasting for at least 8 hours.   BUN 10 8 - 23 mg/dL   Creatinine, Ser 0.89 0.44 - 1.00 mg/dL   Calcium 9.1 8.9 - 10.3 mg/dL   Total Protein 7.2 6.5 - 8.1 g/dL   Albumin 3.7 3.5 - 5.0 g/dL   AST 20 15 - 41 U/L   ALT 14 0 - 44 U/L   Alkaline Phosphatase 53 38 - 126 U/L   Total Bilirubin 0.8 0.3 - 1.2 mg/dL   GFR, Estimated >60 >60 mL/min    Comment: (NOTE) Calculated using the CKD-EPI Creatinine Equation (2021)    Anion gap 11 5 - 15    Comment: Performed at [facility] [address] [place], [place] [address]  CBC with Differential     Status: None   Collection Time: 03/11/23 11:45 AM  Result Value Ref Range   WBC 9.2 4.0 - 10.5 K/uL   RBC 4.57 3.87 - 5.11 MIL/uL  Hemoglobin 13.8 12.0 - 15.0 g/dL   HCT 42.7 36.0 - 46.0 %   MCV 93.4 80.0 - 100.0 fL   MCH 30.2 26.0 - 34.0 pg   MCHC 32.3 30.0 - 36.0 g/dL   RDW 13.9 11.5 - 15.5 %   Platelets 224 150 - 400 K/uL   nRBC 0.0 0.0 - 0.2 %   Neutrophils Relative % 69 %   Neutro Abs 6.2 1.7 - 7.7 K/uL   Lymphocytes Relative 26 %   Lymphs Abs 2.4 0.7 - 4.0 K/uL   Monocytes Relative 5 %   Monocytes Absolute 0.5 0.1 - 1.0 K/uL   Eosinophils Relative 0 %   Eosinophils Absolute 0.0 0.0 - 0.5 K/uL   Basophils Relative 0 %   Basophils Absolute 0.0 0.0 - 0.1 K/uL   Immature Granulocytes 0 %   Abs Immature Granulocytes 0.04 0.00 - 0.07 K/uL    Comment: Performed at [facility] [address] [place], [place] [address]  I-stat chem 8, ED     Status: Abnormal   Collection Time: 03/11/23 12:52 PM  Result Value Ref Range   Sodium 139 135 - 145 mmol/L   Potassium  3.4 (L) 3.5 - 5.1 mmol/L   Chloride 104 98 - 111 mmol/L   BUN 10 8 - 23 mg/dL   Creatinine, Ser 0.70 0.44 - 1.00 mg/dL   Glucose, Bld 93 70 - 99 mg/dL    Comment: Glucose reference range applies only to samples taken after fasting for at least 8 hours.   Calcium, Ion 1.11 (L) 1.15 - 1.40 mmol/L   TCO2 26 22 - 32 mmol/L   Hemoglobin 14.6 12.0 - 15.0 g/dL   HCT 43.0 36.0 - 46.0 %  POC occult blood, ED     Status: Abnormal   Collection Time: 03/11/23  3:00 PM  Result Value Ref Range   Fecal Occult Bld POSITIVE (A) NEGATIVE  Type and screen [place]     Status: None (Preliminary result)   Collection Time: 03/11/23 10:43 PM  Result Value Ref Range   ABO/RH(D) PENDING    Antibody Screen PENDING    Sample Expiration      03/14/2023,2359 Performed at [facility], [place] [address] [place], [place] [address]   ABO/Rh     Status: None (Preliminary result)   Collection Time: 03/11/23 10:48 PM  Result Value Ref Range   ABO/RH(D) PENDING    CT Abdomen Pelvis W Contrast  Result Date: 03/11/2023 CLINICAL DATA:  Left lower quadrant pain. Bloody stools beginning yesterday. EXAM: CT ABDOMEN AND PELVIS WITH CONTRAST TECHNIQUE: Multidetector CT imaging of the abdomen and pelvis was performed using the standard protocol following bolus administration of intravenous contrast. RADIATION DOSE REDUCTION: This exam was performed according to the departmental dose-optimization program which includes automated exposure control, adjustment of the mA and/or kV according to patient size and/or use of iterative reconstruction technique. CONTRAST:  70mL OMNIPAQUE IOHEXOL 350 MG/ML SOLN COMPARISON:  05/16/2004. FINDINGS: Lower chest: Clear lung bases. Hepatobiliary: Liver normal in size and overall attenuation. 3 low-attenuation liver lesions, largest posterior aspect of segment 3, 2.3 cm, next largest dome of the right lobe, segment 7, 7 mm. Tiny lesion noted in segment 4 B. These are  consistent with cysts. No other liver abnormality. Normal gallbladder. No bile duct dilation. Pancreas: Unremarkable. No pancreatic ductal dilatation or surrounding inflammatory changes. Spleen: Normal in size without focal abnormality. Adrenals/Urinary Tract: Adrenal glands are unremarkable. Kidneys  are normal, without renal calculi, focal lesion, or hydronephrosis. Bladder is unremarkable. Stomach/Bowel: Diffuse wall thickening of the left colon beginning at the left transverse colon extending across the splenic flexure and throughout the descending portion into the proximal sigmoid colon. No significant adjacent inflammatory changes. There are multiple diverticula of the sigmoid colon without evidence of diverticulitis. Normal stomach. Small bowel is normal in caliber. No wall thickening or inflammation. No evidence of appendicitis. Vascular/Lymphatic: Minor aortic atherosclerosis. No aneurysm. Prominent to mildly enlarged gastrohepatic ligament lymph nodes, largest 1.3 cm in short axis. No other enlarged lymph nodes. Reproductive: Status post hysterectomy. No adnexal masses. Other: No abdominal wall hernia or abnormality. No abdominopelvic ascites. Musculoskeletal: No fracture or acute finding.  No bone lesion. IMPRESSION: 1. Diffuse wall thickening of the left colon, from the left transverse colon through the proximal sigmoid, consistent with an infectious or inflammatory colitis. 2. Sigmoid colon diverticula, but no evidence of diverticulitis. 3. No other acute abnormality within the abdomen or pelvis. 4. Minor aortic atherosclerosis. Electronically Signed   By: Lajean Manes M.D.   On: 03/11/2023 17:15    Pending Labs Unresulted Labs (From admission, onward)     Start     Ordered   03/12/23 0500  Prealbumin  Tomorrow morning,   R        03/11/23 2238   03/11/23 2345  C-reactive protein  Once,   AD        03/11/23 2345   03/11/23 2345  Magnesium  Once,   AD        03/11/23 2345   03/11/23 2345   Phosphorus  Once,   AD        03/11/23 2345   03/11/23 2323  MRSA Next Gen by PCR, Nasal  Once,   R        03/11/23 2322   03/11/23 2239  Lactic acid, plasma  STAT Now then every 3 hours,   R (with STAT occurrences)     Question:  Release to patient  Answer:  Immediate   03/11/23 2238   03/11/23 2239  TSH  Add-on,   AD        03/11/23 2238   03/11/23 2239  Sedimentation rate  Add-on,   AD        03/11/23 2238   03/11/23 2225  CBC  Now then every 6 hours,   R (with STAT, TIMED occurrences)      03/11/23 2225   Signed and Held  HIV Antibody (routine testing w rflx)  (HIV Antibody (Routine testing w reflex) panel)  Once,   R        Signed and Held   Signed and Held  Magnesium  Tomorrow morning,   R        Signed and Held   Visual merchandiser and Held  Phosphorus  Tomorrow morning,   R        Signed and Held   Signed and Held  Comprehensive metabolic panel  Tomorrow morning,   R       Question:  Release to patient  Answer:  Immediate   Signed and Held   Signed and Held  CBC  Tomorrow morning,   R       Question:  Release to patient  Answer:  Immediate   Signed and Held            Vitals/Pain Today's Vitals   03/11/23 1917 03/11/23 2000 03/11/23 2200 03/12/23 0000  BP: 139/88 134/87 128/67  112/74  Pulse: 75 71 73 65  Resp: 18  18 17   Temp:   98.9 F (37.2 C)   TempSrc:   Oral   SpO2: 100% 99% 96% 99%  Weight:      Height:      PainSc:   3      Isolation Precautions No active isolations  Medications Medications  pantoprazole (PROTONIX) injection 40 mg (has no administration in time range)  metroNIDAZOLE (FLAGYL) IVPB 500 mg (0 mg Intravenous Stopped 03/11/23 1917)  potassium chloride 10 mEq in 100 mL IVPB (has no administration in time range)  nicotine (NICODERM CQ - dosed in mg/24 hr) patch 7 mg (has no administration in time range)  pantoprazole (PROTONIX) 80 mg /NS 100 mL IVPB (0 mg Intravenous Stopped 03/11/23 1746)  iohexol (OMNIPAQUE) 350 MG/ML injection 75 mL (75 mLs  Intravenous Contrast Given 03/11/23 1706)  ciprofloxacin (CIPRO) IVPB 400 mg (0 mg Intravenous Stopped 03/11/23 1917)    Mobility walks     Focused Assessments    R Recommendations: See Admitting Provider Note  Report given to:   Additional Notes: Pt a/o x 4 ambulatory no assist

## 2023-03-12 NOTE — Progress Notes (Signed)
PROGRESS NOTE    Belinda Rhodes  [id number] DOB: 11/04/1961 DOA: 03/11/2023 PCP: Charolette Forward, MD    Brief Narrative:  Patient is 62 year old female with history of hypertension hyperlipidemia and hypothyroidism who presented to the emergency room with bloody stool for 1 day, cramping.  No prior history of GI bleeding.  Hemoglobin is stable in the ER.  CT scan with diffuse left-sided colitis.  Admitted with GI consultation.   Assessment & Plan:   Acute lower GI bleeding secondary to left-sided inflammatory colitis: Hemoglobin has remained stable.  Continue to monitor closely. Remains on ciprofloxacin and Flagyl, will continue.  GI to follow-up.  Will treat with antibiotics.  Depends upon clinical situation, patient may need urgent colonoscopy or interval colonoscopy.  To be decided by GI. Patient currently stable, will allow clears.  If she needs colonoscopy she will need bowel prep.  Essential hypertension: Holding antihypertensives for risk of dropping blood pressure.  Hyperlipidemia: On Lipitor at home.  Resume on discharge.  Hypothyroidism: On Synthroid.  Nicotine dependence: Nicotine patch.  Counseled.   DVT prophylaxis: SCDs Start: 03/12/23 0030   Code Status: Full code Family Communication: Son at the bedside Disposition Plan: Status is: Inpatient Remains inpatient appropriate because: Significant lower GI bleeding     Consultants:  Gastroenterology, [facility]  Procedures:  None  Antimicrobials:  Ciprofloxacin and Flagyl 3/27---   Subjective: Patient seen in the morning rounds.  Denies any complaints.  She did not have any bowel movements or blood per rectum since last 24 hours after coming to the emergency room.  Hemoglobin remains a stable. Patient had screening colonoscopy in 2017 with [facility].  No significant issues reported.  Denies any nausea vomiting.  Hungry.  Objective: Vitals:   03/11/23 2200 03/12/23 0000 03/12/23 0029 03/12/23 0730   BP: 128/67 112/74 121/63 128/73  Pulse: 73 65    Resp: 18 17 15 12   Temp: QA348G F (37.2 C)  98.7 F (37.1 C) 98.6 F (37 C)  TempSrc: Oral  Oral Oral  SpO2: 96% 99% 100% 100%  Weight:      Height:       No intake or output data in the 24 hours ending 03/12/23 0736 Filed Weights   03/11/23 1122  Weight: 81.6 kg    Examination:  General exam: Appears calm and comfortable  Respiratory system: Clear to auscultation. Respiratory effort normal. Cardiovascular system: S1 & S2 heard, RRR. No JVD, murmurs, rubs, gallops or clicks. No pedal edema. Gastrointestinal system: Abdomen is nondistended, soft and nontender. No organomegaly or masses felt. Normal bowel sounds heard. Central nervous system: Alert and oriented. No focal neurological deficits. Extremities: Symmetric 5 x 5 power. Skin: No rashes, lesions or ulcers Psychiatry: Judgement and insight appear normal. Mood & affect appropriate.     Data Reviewed: I have personally reviewed following labs and imaging studies  CBC: Recent Labs  Lab 03/11/23 1145 03/11/23 1252 03/11/23 2243 03/12/23 0431  WBC 9.2  --  7.5 7.6  NEUTROABS 6.2  --   --   --   HGB 13.8 14.6 11.9* 12.4  HCT 42.7 43.0 36.9 38.6  MCV 93.4  --  94.1 92.8  PLT 224  --  198 [id number]   Basic Metabolic Panel: Recent Labs  Lab 03/11/23 1145 03/11/23 1252 03/12/23 0118 03/12/23 0431  NA 136 139  --  138  K 3.4* 3.4*  --  3.6  CL 102 104  --  106  CO2 23  --   --  22  GLUCOSE 95 93  --  79  BUN 10 10  --  13  CREATININE 0.89 0.70  --  0.82  CALCIUM 9.1  --   --  8.7*  MG  --   --  2.0 2.0  PHOS  --   --  3.5 3.6   GFR: Estimated Creatinine Clearance: 77.6 mL/min (by C-G formula based on SCr of 0.82 mg/dL). Liver Function Tests: Recent Labs  Lab 03/11/23 1145 03/12/23 0431  AST 20 17  ALT 14 16  ALKPHOS 53 42  BILITOT 0.8 0.8  PROT 7.2 6.0*  ALBUMIN 3.7 3.1*   No results for input(s): "LIPASE", "AMYLASE" in the last 168 hours. No results  for input(s): "AMMONIA" in the last 168 hours. Coagulation Profile: No results for input(s): "INR", "PROTIME" in the last 168 hours. Cardiac Enzymes: No results for input(s): "CKTOTAL", "CKMB", "CKMBINDEX", "TROPONINI" in the last 168 hours. BNP (last 3 results) No results for input(s): "PROBNP" in the last 8760 hours. HbA1C: No results for input(s): "HGBA1C" in the last 72 hours. CBG: No results for input(s): "GLUCAP" in the last 168 hours. Lipid Profile: No results for input(s): "CHOL", "HDL", "LDLCALC", "TRIG", "CHOLHDL", "LDLDIRECT" in the last 72 hours. Thyroid Function Tests: Recent Labs    03/12/23 0118  TSH 20.263*   Anemia Panel: No results for input(s): "VITAMINB12", "FOLATE", "FERRITIN", "TIBC", "IRON", "RETICCTPCT" in the last 72 hours. Sepsis Labs: Recent Labs  Lab 03/12/23 0117 03/12/23 0431  LATICACIDVEN 0.9 0.7    Recent Results (from the past 240 hour(s))  MRSA Next Gen by PCR, Nasal     Status: None   Collection Time: 03/11/23 11:23 PM   Specimen: Nasal Mucosa; Nasal Swab  Result Value Ref Range Status   MRSA by PCR Next Gen NOT DETECTED NOT DETECTED Final    Comment: (NOTE) The GeneXpert MRSA Assay (FDA approved for NASAL specimens only), is one component of a comprehensive MRSA colonization surveillance program. It is not intended to diagnose MRSA infection nor to guide or monitor treatment for MRSA infections. Test performance is not FDA approved in patients less than 14 years old. Performed at [facility], [place] [address] [place], [place] [address]          Radiology Studies: CT Abdomen Pelvis W Contrast  Result Date: 03/11/2023 CLINICAL DATA:  Left lower quadrant pain. Bloody stools beginning yesterday. EXAM: CT ABDOMEN AND PELVIS WITH CONTRAST TECHNIQUE: Multidetector CT imaging of the abdomen and pelvis was performed using the standard protocol following bolus administration of intravenous contrast. RADIATION DOSE REDUCTION: This  exam was performed according to the departmental dose-optimization program which includes automated exposure control, adjustment of the mA and/or kV according to patient size and/or use of iterative reconstruction technique. CONTRAST:  76mL OMNIPAQUE IOHEXOL 350 MG/ML SOLN COMPARISON:  05/16/2004. FINDINGS: Lower chest: Clear lung bases. Hepatobiliary: Liver normal in size and overall attenuation. 3 low-attenuation liver lesions, largest posterior aspect of segment 3, 2.3 cm, next largest dome of the right lobe, segment 7, 7 mm. Tiny lesion noted in segment 4 B. These are consistent with cysts. No other liver abnormality. Normal gallbladder. No bile duct dilation. Pancreas: Unremarkable. No pancreatic ductal dilatation or surrounding inflammatory changes. Spleen: Normal in size without focal abnormality. Adrenals/Urinary Tract: Adrenal glands are unremarkable. Kidneys are normal, without renal calculi, focal lesion, or hydronephrosis. Bladder is unremarkable. Stomach/Bowel: Diffuse wall thickening of the left colon beginning at the left transverse colon extending across the splenic flexure  and throughout the descending portion into the proximal sigmoid colon. No significant adjacent inflammatory changes. There are multiple diverticula of the sigmoid colon without evidence of diverticulitis. Normal stomach. Small bowel is normal in caliber. No wall thickening or inflammation. No evidence of appendicitis. Vascular/Lymphatic: Minor aortic atherosclerosis. No aneurysm. Prominent to mildly enlarged gastrohepatic ligament lymph nodes, largest 1.3 cm in short axis. No other enlarged lymph nodes. Reproductive: Status post hysterectomy. No adnexal masses. Other: No abdominal wall hernia or abnormality. No abdominopelvic ascites. Musculoskeletal: No fracture or acute finding.  No bone lesion. IMPRESSION: 1. Diffuse wall thickening of the left colon, from the left transverse colon through the proximal sigmoid, consistent with  an infectious or inflammatory colitis. 2. Sigmoid colon diverticula, but no evidence of diverticulitis. 3. No other acute abnormality within the abdomen or pelvis. 4. Minor aortic atherosclerosis. Electronically Signed   By: Lajean Manes M.D.   On: 03/11/2023 17:15        Scheduled Meds:  atorvastatin  40 mg Oral Daily   levothyroxine  50 mcg Oral Q0600   nicotine  7 mg Transdermal Daily   pantoprazole (PROTONIX) IV  40 mg Intravenous Q12H   Continuous Infusions:  sodium chloride 75 mL/hr at 03/12/23 0118   ciprofloxacin     metronidazole 500 mg (03/12/23 0605)     LOS: 1 day    Time spent: 35 minutes    Barb Merino, MD [facility] Pager [phone]

## 2023-03-12 NOTE — TOC Progression Note (Signed)
Transition of Care [facility]) - Progression Note    Patient Details  Name: Belinda Rhodes MRN: [id number] Date of Birth: [birth year]  Transition of Care [facility]) CM/SW Contact  Zenon Mayo, RN Phone Number: 03/12/2023, 2:54 PM  Clinical Narrative:     Transition of Care [facility]) Screening Note   Patient Details  Name: Belinda Rhodes Date of Birth: 16-Aug-1961   Transition of Care [facility]) CM/SW Contact:    Zenon Mayo, RN Phone Number: 03/12/2023, 2:54 PM    Transition of Care Department [facility]) has reviewed patient and no TOC needs have been identified at this time. We will continue to monitor patient advancement through interdisciplinary progression rounds. If new patient transition needs arise, please place a TOC consult.          Expected Discharge Plan and Services                                               Social Determinants of Health (SDOH) Interventions SDOH Screenings   Tobacco Use: High Risk (04/22/2021)    Readmission Risk Interventions     No data to display

## 2023-03-12 NOTE — Consult Note (Signed)
[facility] Gastroenterology Consult  Referring Provider: No ref. provider found Primary Care Physician:  Charolette Forward, MD Primary Gastroenterologist: Dr. Alessandra Bevels   Reason for Consultation: bright red blood per rectum, abnormal CT imaging  SUBJECTIVE:   HPI: Belinda Rhodes is a 62 y.o. female with past medical history significant for hypertension, hypothyroidism, tobacco use. Presented to [facility] on 03/11/23 with bright red blood per rectum.   She is accompanied at bedside by her mother and father on my evaluation today. She noted that she ate snack/dinner on Tuesday, 03/10/23, without issue. On 03/11/23 at roughly 1400, she began to have cramping abdominal discomfort. She had bowel movement which appeared with bloody clots, she noticed having bright red blood per rectum with wiping. She denied overt abdominal pain. No nausea or vomiting. No recent weight loss. No chest pain or shortness of breath. She does take NSAIDs in AM (roughly 3 ibuprofen) as well as Goody powder during the course of the day. No sick contacts. No recent travel. No recent antibiotic use. Smokes roughly 3 cigarettes per day. No alcohol use. No history CVA or MI. Believes she has sleep apnea.  Labs on presentation showed Hgb 12.4, PLT 197, WBC 7.6, Na 138, K 3.6, AST/ALT 17/16, ALP 42, total bilirubin 0.8, lactic acid 0.7. CT scan of abdomen and pelvis with IV contrast showed liver cysts, diffuse wall thickening of left colon from left transverse to the proximal sigmoid colon, sigmoid diverticulosis with no diverticulitis.   COL 09/23/2016 (Dr. Alessandra Bevels) - first colonoscopy for colorectal cancer screening - findings of 6 mm sigmoid colon polyp removed via cold snare, small sigmoid polyp removed via cold forceps, 20 mm sigmoid colon polyp semi-pedunculated status post hot snare polypectomy, 15 mm rectal polyp semi-pedunculated removed via hot snare polypectomy, 6 mm rectosigmoid polyp removed piecemeal via cold  forceps, internal hemorrhoids. She was recommended to have repeat colonoscopy in 6 months, did not complete follow up.   -Pathology: sigmoid (hyperplastic x 4, sessile serrated), ascending colon (sessile serrated), rectum (serrated adenoma).  Past Medical History:  Diagnosis Date   Head injury    Hypertension    Hypothyroid    Past Surgical History:  Procedure Laterality Date   ABDOMINAL HYSTERECTOMY     CESAREAN SECTION     WISDOM TOOTH EXTRACTION     Prior to Admission medications   Medication Sig Start Date End Date Taking? Authorizing Provider  amLODipine (NORVASC) 5 MG tablet Take 5 mg by mouth daily.   Yes [provider]  Aspirin-Acetaminophen-Caffeine (GOODY HEADACHE PO) Take 1 packet by mouth every 8 (eight) hours as needed (pain, headache).   Yes [provider]  atorvastatin (LIPITOR) 40 MG tablet Take 40 mg by mouth daily.   Yes [provider]  ibuprofen (ADVIL) 200 MG tablet Take 600 mg by mouth daily as needed (pain).   Yes [provider]  levothyroxine (SYNTHROID, LEVOTHROID) 50 MCG tablet Take 50 mcg by mouth daily before breakfast.   Yes [provider]  losartan (COZAAR) 100 MG tablet Take 100 mg by mouth daily.   Yes [provider]   Current Facility-Administered Medications  Medication Dose Route Frequency Provider Last Rate Last Admin   acetaminophen (TYLENOL) tablet 650 mg  650 mg Oral Q6H PRN Doutova, Anastassia, MD       Or   acetaminophen (TYLENOL) suppository 650 mg  650 mg Rectal Q6H PRN Doutova, Anastassia, MD       atorvastatin (LIPITOR) tablet 40 mg  40 mg Oral Daily Doutova, Nyoka Lint, MD   40 mg at 03/12/23 [id number]   ciprofloxacin (CIPRO) IVPB 400 mg  400 mg Intravenous Q12H Doutova, Anastassia, MD 200 mL/hr at 03/12/23 0759 400 mg at 03/12/23 0759   HYDROcodone-acetaminophen (NORCO/VICODIN) 5-325 MG per tablet 1-2 tablet  1-2 tablet Oral Q4H PRN Toy Baker, MD       levothyroxine  (SYNTHROID) tablet 50 mcg  50 mcg Oral Q0600 Toy Baker, MD   50 mcg at 03/12/23 0601   metroNIDAZOLE (FLAGYL) IVPB 500 mg  500 mg Intravenous Q12H Doutova, Anastassia, MD 100 mL/hr at 03/12/23 0605 500 mg at 03/12/23 [id number]   nicotine (NICODERM CQ - dosed in mg/24 hr) patch 7 mg  7 mg Transdermal Daily Doutova, Anastassia, MD   7 mg at 03/12/23 0927   ondansetron (ZOFRAN) tablet 4 mg  4 mg Oral Q6H PRN Toy Baker, MD       Or   ondansetron (ZOFRAN) injection 4 mg  4 mg Intravenous Q6H PRN Doutova, Anastassia, MD       pantoprazole (PROTONIX) injection 40 mg  40 mg Intravenous Q12H Doutova, Anastassia, MD   40 mg at 03/12/23 0601   Allergies as of 03/11/2023   (No Known Allergies)   No family history on file. Social History   Socioeconomic History   Marital status: Married    Spouse name: Not on file   Number of children: Not on file   Years of education: Not on file   Highest education level: Not on file  Occupational History   Not on file  Tobacco Use   Smoking status: Every Day    Packs/day: .5    Types: Cigarettes   Smokeless tobacco: Never  Substance and Sexual Activity   Alcohol use: No   Drug use: No   Sexual activity: Not on file  Other Topics Concern   Not on file  Social History Narrative   Not on file   Social Determinants of Health   Financial Resource Strain: Not on file  Food Insecurity: Not on file  Transportation Needs: Not on file  Physical Activity: Not on file  Stress: Not on file  Social Connections: Not on file  Intimate Partner Violence: Not on file   Review of Systems:  Review of Systems  Constitutional:  Negative for weight loss.  Respiratory:  Negative for shortness of breath.   Cardiovascular:  Negative for chest pain.  Gastrointestinal:  Positive for abdominal pain, blood in stool and diarrhea. Negative for constipation, melena, nausea and vomiting.       Straining with bowel movement.    OBJECTIVE:   Temp:  [98.6 F  (37 C)-98.9 F (37.2 C)] 98.6 F (37 C) (03/28 1110) Pulse Rate:  [65-89] 65 (03/28 0000) Resp:  [12-18] 18 (03/28 1110) BP: (105-149)/(63-88) 137/68 (03/28 1110) SpO2:  [96 %-100 %] 100 % (03/28 1110)   Physical Exam Constitutional:      General: She is not in acute distress.    Appearance: She is not ill-appearing, toxic-appearing or diaphoretic.  Cardiovascular:     Rate and Rhythm: Normal rate and regular rhythm.  Pulmonary:     Effort: No respiratory distress.     Breath sounds: Normal breath sounds.  Abdominal:     General: Bowel sounds are normal. There is no distension.     Palpations: Abdomen is soft.     Tenderness: There is no abdominal tenderness. There is no guarding.  Musculoskeletal:  Right lower leg: No edema.     Left lower leg: No edema.  Skin:    General: Skin is warm and dry.  Neurological:     Mental Status: She is alert.     Labs: Recent Labs    03/11/23 1145 03/11/23 1252 03/11/23 2243 03/12/23 0431  WBC 9.2  --  7.5 7.6  HGB 13.8 14.6 11.9* 12.4  HCT 42.7 43.0 36.9 38.6  PLT 224  --  198 197   BMET Recent Labs    03/11/23 1145 03/11/23 1252 03/12/23 0431  NA 136 139 138  K 3.4* 3.4* 3.6  CL 102 104 106  CO2 23  --  22  GLUCOSE 95 93 79  BUN 10 10 13   CREATININE 0.89 0.70 0.82  CALCIUM 9.1  --  8.7*   LFT Recent Labs    03/12/23 0431  PROT 6.0*  ALBUMIN 3.1*  AST 17  ALT 16  ALKPHOS 42  BILITOT 0.8   PT/INR No results for input(s): "LABPROT", "INR" in the last 72 hours.  Diagnostic imaging: CT Abdomen Pelvis W Contrast  Result Date: 03/11/2023 CLINICAL DATA:  Left lower quadrant pain. Bloody stools beginning yesterday. EXAM: CT ABDOMEN AND PELVIS WITH CONTRAST TECHNIQUE: Multidetector CT imaging of the abdomen and pelvis was performed using the standard protocol following bolus administration of intravenous contrast. RADIATION DOSE REDUCTION: This exam was performed according to the departmental dose-optimization  program which includes automated exposure control, adjustment of the mA and/or kV according to patient size and/or use of iterative reconstruction technique. CONTRAST:  63mL OMNIPAQUE IOHEXOL 350 MG/ML SOLN COMPARISON:  05/16/2004. FINDINGS: Lower chest: Clear lung bases. Hepatobiliary: Liver normal in size and overall attenuation. 3 low-attenuation liver lesions, largest posterior aspect of segment 3, 2.3 cm, next largest dome of the right lobe, segment 7, 7 mm. Tiny lesion noted in segment 4 B. These are consistent with cysts. No other liver abnormality. Normal gallbladder. No bile duct dilation. Pancreas: Unremarkable. No pancreatic ductal dilatation or surrounding inflammatory changes. Spleen: Normal in size without focal abnormality. Adrenals/Urinary Tract: Adrenal glands are unremarkable. Kidneys are normal, without renal calculi, focal lesion, or hydronephrosis. Bladder is unremarkable. Stomach/Bowel: Diffuse wall thickening of the left colon beginning at the left transverse colon extending across the splenic flexure and throughout the descending portion into the proximal sigmoid colon. No significant adjacent inflammatory changes. There are multiple diverticula of the sigmoid colon without evidence of diverticulitis. Normal stomach. Small bowel is normal in caliber. No wall thickening or inflammation. No evidence of appendicitis. Vascular/Lymphatic: Minor aortic atherosclerosis. No aneurysm. Prominent to mildly enlarged gastrohepatic ligament lymph nodes, largest 1.3 cm in short axis. No other enlarged lymph nodes. Reproductive: Status post hysterectomy. No adnexal masses. Other: No abdominal wall hernia or abnormality. No abdominopelvic ascites. Musculoskeletal: No fracture or acute finding.  No bone lesion. IMPRESSION: 1. Diffuse wall thickening of the left colon, from the left transverse colon through the proximal sigmoid, consistent with an infectious or inflammatory colitis. 2. Sigmoid colon  diverticula, but no evidence of diverticulitis. 3. No other acute abnormality within the abdomen or pelvis. 4. Minor aortic atherosclerosis. Electronically Signed   By: Lajean Manes M.D.   On: 03/11/2023 17:15    IMPRESSION: Hematochezia Abnormal CT imaging Abdominal pain/cramping Personal history high risk colon polyps  PLAN: -Onset of symptoms suggests an acute infectious etiology, will obtain stool testing to rule out infectious source -If infectious etiology negative, recommend colonoscopy to further evaluate abnormal CT imaging, patient  agreeable to colonoscopy, she would like to eat solid foods today and complete bowel preparation tomorrow for procedure on 03/14/23 -Trend H/H, transfuse for Hgb < 7  -Continue empiric antibiotic therapy for now -[facility] for regular diet today, clear liquid diet 03/13/23 [facility] will follow   LOS: 1 day   Danton Clap, [facility] Gastroenterology

## 2023-03-13 ENCOUNTER — Inpatient Hospital Stay (HOSPITAL_COMMUNITY): Payer: Commercial Managed Care - HMO

## 2023-03-13 DIAGNOSIS — K922 Gastrointestinal hemorrhage, unspecified: Secondary | ICD-10-CM | POA: Diagnosis not present

## 2023-03-13 LAB — URIC ACID: Uric Acid, Serum: 3.1 mg/dL (ref 2.5–7.1)

## 2023-03-13 MED ORDER — MORPHINE SULFATE (PF) 2 MG/ML IV SOLN: 2.0000 mg | INTRAVENOUS | Status: DC | PRN

## 2023-03-13 MED ORDER — AMLODIPINE BESYLATE 5 MG PO TABS
5.0000 mg | ORAL_TABLET | Freq: Every day | ORAL | Status: DC
  Administered 2023-03-13 – 2023-03-14 (×2): 5 mg via ORAL
  Filled 2023-03-13 (×2): qty 1

## 2023-03-13 MED ORDER — BISACODYL 5 MG PO TBEC
5.0000 mg | DELAYED_RELEASE_TABLET | Freq: Once | ORAL | Status: AC
  Administered 2023-03-13: 5 mg via ORAL
  Filled 2023-03-13: qty 1

## 2023-03-13 MED ORDER — COLCHICINE 0.6 MG PO TABS
1.2000 mg | ORAL_TABLET | Freq: Once | ORAL | Status: AC
  Administered 2023-03-13: 1.2 mg via ORAL
  Filled 2023-03-13: qty 2

## 2023-03-13 MED ORDER — PEG 3350-KCL-NA BICARB-NACL 420 G PO SOLR
4000.0000 mL | Freq: Once | ORAL | Status: AC
  Administered 2023-03-13: 4000 mL via ORAL
  Filled 2023-03-13: qty 4000

## 2023-03-13 MED ORDER — LOSARTAN POTASSIUM 50 MG PO TABS
100.0000 mg | ORAL_TABLET | Freq: Every day | ORAL | Status: DC
  Administered 2023-03-13 – 2023-03-14 (×2): 100 mg via ORAL
  Filled 2023-03-13 (×2): qty 2

## 2023-03-13 NOTE — H&P (View-Only) (Signed)
[facility] Progress Note  SUBJECTIVE:   Interval history: Belinda Rhodes was seen and evaluated today at bedside.  She was resting comfortably in bed.  Noted that she had new right-sided ankle/foot pain, worse with putting weight/pressure on it.  No abdominal pain.  She has not had a bowel movement since I saw her last, stool studies have not been completed.  Had some nausea this morning though no vomiting.  She is tolerating a diet.  No chest pain or shortness of breath.  She is agreeable to colonoscopy tomorrow.  Past Medical History:  Diagnosis Date   Head injury    Hypertension    Hypothyroid    Past Surgical History:  Procedure Laterality Date   ABDOMINAL HYSTERECTOMY     CESAREAN SECTION     WISDOM TOOTH EXTRACTION     Current Facility-Administered Medications  Medication Dose Route Frequency Provider Last Rate Last Admin   acetaminophen (TYLENOL) tablet 650 mg  650 mg Oral Q6H PRN Doutova, Anastassia, MD       Or   acetaminophen (TYLENOL) suppository 650 mg  650 mg Rectal Q6H PRN Doutova, Anastassia, MD       amLODipine (NORVASC) tablet 5 mg  5 mg Oral Daily Barb Merino, MD   5 mg at 03/13/23 0939   atorvastatin (LIPITOR) tablet 40 mg  40 mg Oral Daily Doutova, Anastassia, MD   40 mg at 03/13/23 0906   ciprofloxacin (CIPRO) IVPB 400 mg  400 mg Intravenous Q12H Doutova, Anastassia, MD 200 mL/hr at 03/13/23 0822 400 mg at 03/13/23 [id number]   HYDROcodone-acetaminophen (NORCO/VICODIN) 5-325 MG per tablet 1-2 tablet  1-2 tablet Oral Q4H PRN Toy Baker, MD   2 tablet at 03/13/23 1058   levothyroxine (SYNTHROID) tablet 50 mcg  50 mcg Oral Q0600 Toy Baker, MD   50 mcg at 03/13/23 0818   losartan (COZAAR) tablet 100 mg  100 mg Oral Daily Barb Merino, MD   100 mg at 03/13/23 0939   metroNIDAZOLE (FLAGYL) IVPB 500 mg  500 mg Intravenous Q12H Doutova, Anastassia, MD 100 mL/hr at 03/13/23 0521 500 mg at 03/13/23 0521   nicotine (NICODERM CQ - dosed in  mg/24 hr) patch 7 mg  7 mg Transdermal Daily Doutova, Anastassia, MD   7 mg at 03/13/23 0906   ondansetron (ZOFRAN) tablet 4 mg  4 mg Oral Q6H PRN Toy Baker, MD       Or   ondansetron (ZOFRAN) injection 4 mg  4 mg Intravenous Q6H PRN Doutova, Anastassia, MD   4 mg at 03/13/23 0906   pantoprazole (PROTONIX) injection 40 mg  40 mg Intravenous Q12H Doutova, Anastassia, MD   40 mg at 03/13/23 0906   polyethylene glycol-electrolytes (NuLYTELY) solution 4,000 mL  4,000 mL Oral Once Loney Laurence, DO       Allergies as of 03/11/2023   (No Known Allergies)   Review of Systems:  ROS  OBJECTIVE:   Temp:  [97.6 F (36.4 C)-98.7 F (37.1 C)] 97.6 F (36.4 C) (03/29 1038) Pulse Rate:  [60-76] 76 (03/29 1038) Resp:  [12-20] 15 (03/29 1038) BP: (123-173)/(65-82) 151/69 (03/29 1100) SpO2:  [94 %-100 %] 100 % (03/29 1038) Last BM Date : 03/12/23 Physical Exam  Labs: Recent Labs    03/12/23 1046 03/12/23 1659 03/12/23 2216  WBC 7.1 7.9 8.4  HGB 12.4 11.6* 11.8*  HCT 37.4 36.2 35.7*  PLT 188 194 176   BMET Recent Labs    03/11/23 1145 03/11/23 1252 03/12/23  0431  NA 136 139 138  K 3.4* 3.4* 3.6  CL 102 104 106  CO2 23  --  22  GLUCOSE 95 93 79  BUN 10 10 13   CREATININE 0.89 0.70 0.82  CALCIUM 9.1  --  8.7*   LFT Recent Labs    03/12/23 0431  PROT 6.0*  ALBUMIN 3.1*  AST 17  ALT 16  ALKPHOS 42  BILITOT 0.8   PT/INR No results for input(s): "LABPROT", "INR" in the last 72 hours. Diagnostic imaging: CT Abdomen Pelvis W Contrast  Result Date: 03/11/2023 CLINICAL DATA:  Left lower quadrant pain. Bloody stools beginning yesterday. EXAM: CT ABDOMEN AND PELVIS WITH CONTRAST TECHNIQUE: Multidetector CT imaging of the abdomen and pelvis was performed using the standard protocol following bolus administration of intravenous contrast. RADIATION DOSE REDUCTION: This exam was performed according to the departmental dose-optimization program which includes automated  exposure control, adjustment of the mA and/or kV according to patient size and/or use of iterative reconstruction technique. CONTRAST:  37mL OMNIPAQUE IOHEXOL 350 MG/ML SOLN COMPARISON:  05/16/2004. FINDINGS: Lower chest: Clear lung bases. Hepatobiliary: Liver normal in size and overall attenuation. 3 low-attenuation liver lesions, largest posterior aspect of segment 3, 2.3 cm, next largest dome of the right lobe, segment 7, 7 mm. Tiny lesion noted in segment 4 B. These are consistent with cysts. No other liver abnormality. Normal gallbladder. No bile duct dilation. Pancreas: Unremarkable. No pancreatic ductal dilatation or surrounding inflammatory changes. Spleen: Normal in size without focal abnormality. Adrenals/Urinary Tract: Adrenal glands are unremarkable. Kidneys are normal, without renal calculi, focal lesion, or hydronephrosis. Bladder is unremarkable. Stomach/Bowel: Diffuse wall thickening of the left colon beginning at the left transverse colon extending across the splenic flexure and throughout the descending portion into the proximal sigmoid colon. No significant adjacent inflammatory changes. There are multiple diverticula of the sigmoid colon without evidence of diverticulitis. Normal stomach. Small bowel is normal in caliber. No wall thickening or inflammation. No evidence of appendicitis. Vascular/Lymphatic: Minor aortic atherosclerosis. No aneurysm. Prominent to mildly enlarged gastrohepatic ligament lymph nodes, largest 1.3 cm in short axis. No other enlarged lymph nodes. Reproductive: Status post hysterectomy. No adnexal masses. Other: No abdominal wall hernia or abnormality. No abdominopelvic ascites. Musculoskeletal: No fracture or acute finding.  No bone lesion. IMPRESSION: 1. Diffuse wall thickening of the left colon, from the left transverse colon through the proximal sigmoid, consistent with an infectious or inflammatory colitis. 2. Sigmoid colon diverticula, but no evidence of  diverticulitis. 3. No other acute abnormality within the abdomen or pelvis. 4. Minor aortic atherosclerosis. Electronically Signed   By: Lajean Manes M.D.   On: 03/11/2023 17:15    IMPRESSION: Hematochezia, no further Abnormal CT imaging  -Diffuse wall thickening of colon from left transverse through proximal sigmoid  -No bowel movements overnight, unable to complete stool testing Abdominal pain/cramping, resolved Personal history high risk colon polyps  -Colonoscopy 09/23/2016 (Dr. Angelyn Punt mm sigmoid colon polyp, 15 mm rectum polyp, 6 mm sigmoid polyp, 6 mm rectosigmoid polyp, internal hemorrhoids Normocytic anemia  PLAN: -Recommend colonoscopy to evaluate abnormal CT imaging as well as personal history colon polyps -I personally discussed the benefits, alternatives and risks of colonoscopy procedure with patient including bleeding/infection/perforation/missed lesion/polyp/anesthesia risk, she verbalized understanding and elected to proceed -Complete stool studies if able -Clear liquid diet today -Bowel preparation this afternoon, n.p.o. at midnight for colonoscopy 03/14/2023 -Recommendations to follow pending endoscopy   LOS: 2 days   Danton Clap, [facility] Gastroenterology

## 2023-03-13 NOTE — Anesthesia Preprocedure Evaluation (Addendum)
Anesthesia Evaluation  Patient identified by MRN, date of birth, ID band Patient awake    Reviewed: Allergy & Precautions, NPO status , Patient's Chart, lab work & pertinent test results  History of Anesthesia Complications Negative for: history of anesthetic complications  Airway Mallampati: I  TM Distance: >3 FB Neck ROM: Full    Dental  (+) Poor Dentition, Chipped, Missing, Dental Advisory Given   Pulmonary Current Smoker and Patient abstained from smoking.   breath sounds clear to auscultation       Cardiovascular hypertension, Pt. on medications (-) angina  Rhythm:Regular Rate:Normal     Neuro/Psych negative neurological ROS     GI/Hepatic Neg liver ROS,,,GI bleed   Endo/Other  Hypothyroidism    Renal/GU negative Renal ROS     Musculoskeletal   Abdominal   Peds  Hematology negative hematology ROS (+)   Anesthesia Other Findings   Reproductive/Obstetrics                             Anesthesia Physical Anesthesia Plan  ASA: 2  Anesthesia Plan: MAC   Post-op Pain Management:    Induction:   PONV Risk Score and Plan: 1 and Treatment may vary due to age or medical condition and Ondansetron  Airway Management Planned: Natural Airway and Simple Face Mask  Additional Equipment: None  Intra-op Plan:   Post-operative Plan:   Informed Consent: I have reviewed the patients History and Physical, chart, labs and discussed the procedure including the risks, benefits and alternatives for the proposed anesthesia with the patient or authorized representative who has indicated his/her understanding and acceptance.     Dental advisory given  Plan Discussed with: CRNA and Surgeon  Anesthesia Plan Comments:         Anesthesia Quick Evaluation

## 2023-03-13 NOTE — Progress Notes (Signed)
PROGRESS NOTE    Belinda Rhodes  [id number] DOB: [birth year] DOA: 03/11/2023 PCP: Charolette Forward, MD    Brief Narrative:  Patient is 62 year old female with history of hypertension, hyperlipidemia and hypothyroidism who presented to the emergency room with bloody stool for 1 day, cramping.  No prior history of GI bleeding.  Hemoglobin is stable in the ER.  CT scan with diffuse left-sided colitis.  Admitted with GI consultation.   Assessment & Plan:   Acute lower GI bleeding secondary to left-sided inflammatory colitis: Hemoglobin has remained stable.  Continue to monitor closely. No BM since admission.  Remains on ciprofloxacin and Flagyl, will continue.  GI to follow-up.  Previous history of hyperplastic polyps.  No follow-up colonoscopy.  Bowel prep and colonoscopy tomorrow.   Essential hypertension: Blood pressures are adequate.  Resume home medications including amlodipine and losartan.  Hyperlipidemia: On Lipitor at home.  Resume on discharge.  Hypothyroidism: On Synthroid.  Nicotine dependence: Nicotine patch.  Counseled.  Right foot pain: Patient has sudden onset of right foot pain,, mostly in the great toe area.  No obvious swelling or fluctuation.  Will check uric acid.  Colchicine 1.2 mg once.  Continue to mobilize.  Given clinical stability, she can be transferred to MedSurg bed.   DVT prophylaxis: SCDs Start: 03/12/23 0030   Code Status: Full code Family Communication: None at the bedside. Disposition Plan: Status is: Inpatient Remains inpatient appropriate because: Significant lower GI bleeding.  Inpatient procedures.     Consultants:  Gastroenterology, [facility]  Procedures:  None  Antimicrobials:  Ciprofloxacin and Flagyl 3/27---   Subjective:  Patient seen and examined.  Denies any nausea vomiting or abdominal pain.  She has not have any bowel movement since admission.  Passing flatus. She started having pain on her right foot dorsal area  since yesterday while stepping on the floor.  Does not have any history of gout or arthritis.  Afebrile.  Prepared for colonoscopy tomorrow.  Objective: Vitals:   03/12/23 1926 03/12/23 2305 03/13/23 0306 03/13/23 0827  BP: (!) 173/77 (!) 155/78 (!) 145/81 (!) 143/71  Pulse: 64 69 69 60  Resp: 13 15 13 12   Temp: 98.2 F (36.8 C) 98.3 F (36.8 C) 98.4 F (36.9 C) 98 F (36.7 C)  TempSrc: Oral Oral Oral Oral  SpO2: 100% 94% 97% 100%  Weight:      Height:        Intake/Output Summary (Last 24 hours) at 03/13/2023 1020 Last data filed at 03/13/2023 0544 Gross per 24 hour  Intake 999.07 ml  Output --  Net 999.07 ml   Filed Weights   03/11/23 1122  Weight: 81.6 kg    Examination:  General exam: Appears calm and comfortable  Respiratory system: Clear to auscultation. Respiratory effort normal. Cardiovascular system: S1 & S2 heard, RRR.  Gastrointestinal system: Soft nontender.   Central nervous system: Alert and oriented. No focal neurological deficits. Extremities: Symmetric 5 x 5 power. Right foot along the first metatarsal phalangeal joint she has mild tenderness without joint effusion, restriction.    Data Reviewed: I have personally reviewed following labs and imaging studies  CBC: Recent Labs  Lab 03/11/23 1145 03/11/23 1252 03/11/23 2243 03/12/23 0431 03/12/23 1046 03/12/23 1659 03/12/23 2216  WBC 9.2  --  7.5 7.6 7.1 7.9 8.4  NEUTROABS 6.2  --   --   --   --   --   --   HGB 13.8   < > 11.9* 12.4 12.4  11.6* 11.8*  HCT 42.7   < > 36.9 38.6 37.4 36.2 35.7*  MCV 93.4  --  94.1 92.8 92.8 93.5 92.0  PLT 224  --  198 197 188 194 176   < > = values in this interval not displayed.    Basic Metabolic Panel: Recent Labs  Lab 03/11/23 1145 03/11/23 1252 03/12/23 0118 03/12/23 0431  NA 136 139  --  138  K 3.4* 3.4*  --  3.6  CL 102 104  --  106  CO2 23  --   --  22  GLUCOSE 95 93  --  79  BUN 10 10  --  13  CREATININE 0.89 0.70  --  0.82  CALCIUM 9.1   --   --  8.7*  MG  --   --  2.0 2.0  PHOS  --   --  3.5 3.6    GFR: Estimated Creatinine Clearance: 77.6 mL/min (by C-G formula based on SCr of 0.82 mg/dL). Liver Function Tests: Recent Labs  Lab 03/11/23 1145 03/12/23 0431  AST 20 17  ALT 14 16  ALKPHOS 53 42  BILITOT 0.8 0.8  PROT 7.2 6.0*  ALBUMIN 3.7 3.1*    No results for input(s): "LIPASE", "AMYLASE" in the last 168 hours. No results for input(s): "AMMONIA" in the last 168 hours. Coagulation Profile: No results for input(s): "INR", "PROTIME" in the last 168 hours. Cardiac Enzymes: No results for input(s): "CKTOTAL", "CKMB", "CKMBINDEX", "TROPONINI" in the last 168 hours. BNP (last 3 results) No results for input(s): "PROBNP" in the last 8760 hours. HbA1C: No results for input(s): "HGBA1C" in the last 72 hours. CBG: No results for input(s): "GLUCAP" in the last 168 hours. Lipid Profile: No results for input(s): "CHOL", "HDL", "LDLCALC", "TRIG", "CHOLHDL", "LDLDIRECT" in the last 72 hours. Thyroid Function Tests: Recent Labs    03/12/23 0118  TSH 20.263*    Anemia Panel: No results for input(s): "VITAMINB12", "FOLATE", "FERRITIN", "TIBC", "IRON", "RETICCTPCT" in the last 72 hours. Sepsis Labs: Recent Labs  Lab 03/12/23 0117 03/12/23 0431  LATICACIDVEN 0.9 0.7     Recent Results (from the past 240 hour(s))  MRSA Next Gen by PCR, Nasal     Status: None   Collection Time: 03/11/23 11:23 PM   Specimen: Nasal Mucosa; Nasal Swab  Result Value Ref Range Status   MRSA by PCR Next Gen NOT DETECTED NOT DETECTED Final    Comment: (NOTE) The GeneXpert MRSA Assay (FDA approved for NASAL specimens only), is one component of a comprehensive MRSA colonization surveillance program. It is not intended to diagnose MRSA infection nor to guide or monitor treatment for MRSA infections. Test performance is not FDA approved in patients less than 29 years old. Performed at [facility], [address],  [place], [place] [address]          Radiology Studies: CT Abdomen Pelvis W Contrast  Result Date: 03/11/2023 CLINICAL DATA:  Left lower quadrant pain. Bloody stools beginning yesterday. EXAM: CT ABDOMEN AND PELVIS WITH CONTRAST TECHNIQUE: Multidetector CT imaging of the abdomen and pelvis was performed using the standard protocol following bolus administration of intravenous contrast. RADIATION DOSE REDUCTION: This exam was performed according to the departmental dose-optimization program which includes automated exposure control, adjustment of the mA and/or kV according to patient size and/or use of iterative reconstruction technique. CONTRAST:  26mL OMNIPAQUE IOHEXOL 350 MG/ML SOLN COMPARISON:  05/16/2004. FINDINGS: Lower chest: Clear lung bases. Hepatobiliary: Liver normal in size and  overall attenuation. 3 low-attenuation liver lesions, largest posterior aspect of segment 3, 2.3 cm, next largest dome of the right lobe, segment 7, 7 mm. Tiny lesion noted in segment 4 B. These are consistent with cysts. No other liver abnormality. Normal gallbladder. No bile duct dilation. Pancreas: Unremarkable. No pancreatic ductal dilatation or surrounding inflammatory changes. Spleen: Normal in size without focal abnormality. Adrenals/Urinary Tract: Adrenal glands are unremarkable. Kidneys are normal, without renal calculi, focal lesion, or hydronephrosis. Bladder is unremarkable. Stomach/Bowel: Diffuse wall thickening of the left colon beginning at the left transverse colon extending across the splenic flexure and throughout the descending portion into the proximal sigmoid colon. No significant adjacent inflammatory changes. There are multiple diverticula of the sigmoid colon without evidence of diverticulitis. Normal stomach. Small bowel is normal in caliber. No wall thickening or inflammation. No evidence of appendicitis. Vascular/Lymphatic: Minor aortic atherosclerosis. No aneurysm. Prominent to mildly enlarged  gastrohepatic ligament lymph nodes, largest 1.3 cm in short axis. No other enlarged lymph nodes. Reproductive: Status post hysterectomy. No adnexal masses. Other: No abdominal wall hernia or abnormality. No abdominopelvic ascites. Musculoskeletal: No fracture or acute finding.  No bone lesion. IMPRESSION: 1. Diffuse wall thickening of the left colon, from the left transverse colon through the proximal sigmoid, consistent with an infectious or inflammatory colitis. 2. Sigmoid colon diverticula, but no evidence of diverticulitis. 3. No other acute abnormality within the abdomen or pelvis. 4. Minor aortic atherosclerosis. Electronically Signed   By: Lajean Manes M.D.   On: 03/11/2023 17:15        Scheduled Meds:  amLODipine  5 mg Oral Daily   atorvastatin  40 mg Oral Daily   bisacodyl  5 mg Oral Once   levothyroxine  50 mcg Oral Q0600   losartan  100 mg Oral Daily   nicotine  7 mg Transdermal Daily   pantoprazole (PROTONIX) IV  40 mg Intravenous Q12H   polyethylene glycol-electrolytes  4,000 mL Oral Once   Continuous Infusions:  ciprofloxacin 400 mg (03/13/23 0822)   metronidazole 500 mg (03/13/23 0521)     LOS: 2 days    Time spent: 35 minutes    Barb Merino, MD [facility] Pager [phone]

## 2023-03-13 NOTE — Progress Notes (Signed)
[facility] Progress Note  SUBJECTIVE:   Interval history: Belinda Rhodes was seen and evaluated today at bedside.  She was resting comfortably in bed.  Noted that she had new right-sided ankle/foot pain, worse with putting weight/pressure on it.  No abdominal pain.  She has not had a bowel movement since I saw her last, stool studies have not been completed.  Had some nausea this morning though no vomiting.  She is tolerating a diet.  No chest pain or shortness of breath.  She is agreeable to colonoscopy tomorrow.  Past Medical History:  Diagnosis Date   Head injury    Hypertension    Hypothyroid    Past Surgical History:  Procedure Laterality Date   ABDOMINAL HYSTERECTOMY     CESAREAN SECTION     WISDOM TOOTH EXTRACTION     Current Facility-Administered Medications  Medication Dose Route Frequency Provider Last Rate Last Admin   acetaminophen (TYLENOL) tablet 650 mg  650 mg Oral Q6H PRN Doutova, Anastassia, MD       Or   acetaminophen (TYLENOL) suppository 650 mg  650 mg Rectal Q6H PRN Doutova, Anastassia, MD       amLODipine (NORVASC) tablet 5 mg  5 mg Oral Daily Barb Merino, MD   5 mg at 03/13/23 0939   atorvastatin (LIPITOR) tablet 40 mg  40 mg Oral Daily Doutova, Anastassia, MD   40 mg at 03/13/23 0906   ciprofloxacin (CIPRO) IVPB 400 mg  400 mg Intravenous Q12H Doutova, Anastassia, MD 200 mL/hr at 03/13/23 0822 400 mg at 03/13/23 [id number]   HYDROcodone-acetaminophen (NORCO/VICODIN) 5-325 MG per tablet 1-2 tablet  1-2 tablet Oral Q4H PRN Toy Baker, MD   2 tablet at 03/13/23 1058   levothyroxine (SYNTHROID) tablet 50 mcg  50 mcg Oral Q0600 Toy Baker, MD   50 mcg at 03/13/23 0818   losartan (COZAAR) tablet 100 mg  100 mg Oral Daily Barb Merino, MD   100 mg at 03/13/23 0939   metroNIDAZOLE (FLAGYL) IVPB 500 mg  500 mg Intravenous Q12H Doutova, Anastassia, MD 100 mL/hr at 03/13/23 0521 500 mg at 03/13/23 0521   nicotine (NICODERM CQ - dosed in  mg/24 hr) patch 7 mg  7 mg Transdermal Daily Doutova, Anastassia, MD   7 mg at 03/13/23 0906   ondansetron (ZOFRAN) tablet 4 mg  4 mg Oral Q6H PRN Toy Baker, MD       Or   ondansetron (ZOFRAN) injection 4 mg  4 mg Intravenous Q6H PRN Doutova, Anastassia, MD   4 mg at 03/13/23 0906   pantoprazole (PROTONIX) injection 40 mg  40 mg Intravenous Q12H Doutova, Anastassia, MD   40 mg at 03/13/23 0906   polyethylene glycol-electrolytes (NuLYTELY) solution 4,000 mL  4,000 mL Oral Once Loney Laurence, DO       Allergies as of 03/11/2023   (No Known Allergies)   Review of Systems:  ROS  OBJECTIVE:   Temp:  [97.6 F (36.4 C)-98.7 F (37.1 C)] 97.6 F (36.4 C) (03/29 1038) Pulse Rate:  [60-76] 76 (03/29 1038) Resp:  [12-20] 15 (03/29 1038) BP: (123-173)/(65-82) 151/69 (03/29 1100) SpO2:  [94 %-100 %] 100 % (03/29 1038) Last BM Date : 03/12/23 Physical Exam  Labs: Recent Labs    03/12/23 1046 03/12/23 1659 03/12/23 2216  WBC 7.1 7.9 8.4  HGB 12.4 11.6* 11.8*  HCT 37.4 36.2 35.7*  PLT 188 194 176   BMET Recent Labs    03/11/23 1145 03/11/23 1252 03/12/23  0431  NA 136 139 138  K 3.4* 3.4* 3.6  CL 102 104 106  CO2 23  --  22  GLUCOSE 95 93 79  BUN 10 10 13   CREATININE 0.89 0.70 0.82  CALCIUM 9.1  --  8.7*   LFT Recent Labs    03/12/23 0431  PROT 6.0*  ALBUMIN 3.1*  AST 17  ALT 16  ALKPHOS 42  BILITOT 0.8   PT/INR No results for input(s): "LABPROT", "INR" in the last 72 hours. Diagnostic imaging: CT Abdomen Pelvis W Contrast  Result Date: 03/11/2023 CLINICAL DATA:  Left lower quadrant pain. Bloody stools beginning yesterday. EXAM: CT ABDOMEN AND PELVIS WITH CONTRAST TECHNIQUE: Multidetector CT imaging of the abdomen and pelvis was performed using the standard protocol following bolus administration of intravenous contrast. RADIATION DOSE REDUCTION: This exam was performed according to the departmental dose-optimization program which includes automated  exposure control, adjustment of the mA and/or kV according to patient size and/or use of iterative reconstruction technique. CONTRAST:  89mL OMNIPAQUE IOHEXOL 350 MG/ML SOLN COMPARISON:  05/16/2004. FINDINGS: Lower chest: Clear lung bases. Hepatobiliary: Liver normal in size and overall attenuation. 3 low-attenuation liver lesions, largest posterior aspect of segment 3, 2.3 cm, next largest dome of the right lobe, segment 7, 7 mm. Tiny lesion noted in segment 4 B. These are consistent with cysts. No other liver abnormality. Normal gallbladder. No bile duct dilation. Pancreas: Unremarkable. No pancreatic ductal dilatation or surrounding inflammatory changes. Spleen: Normal in size without focal abnormality. Adrenals/Urinary Tract: Adrenal glands are unremarkable. Kidneys are normal, without renal calculi, focal lesion, or hydronephrosis. Bladder is unremarkable. Stomach/Bowel: Diffuse wall thickening of the left colon beginning at the left transverse colon extending across the splenic flexure and throughout the descending portion into the proximal sigmoid colon. No significant adjacent inflammatory changes. There are multiple diverticula of the sigmoid colon without evidence of diverticulitis. Normal stomach. Small bowel is normal in caliber. No wall thickening or inflammation. No evidence of appendicitis. Vascular/Lymphatic: Minor aortic atherosclerosis. No aneurysm. Prominent to mildly enlarged gastrohepatic ligament lymph nodes, largest 1.3 cm in short axis. No other enlarged lymph nodes. Reproductive: Status post hysterectomy. No adnexal masses. Other: No abdominal wall hernia or abnormality. No abdominopelvic ascites. Musculoskeletal: No fracture or acute finding.  No bone lesion. IMPRESSION: 1. Diffuse wall thickening of the left colon, from the left transverse colon through the proximal sigmoid, consistent with an infectious or inflammatory colitis. 2. Sigmoid colon diverticula, but no evidence of  diverticulitis. 3. No other acute abnormality within the abdomen or pelvis. 4. Minor aortic atherosclerosis. Electronically Signed   By: Lajean Manes M.D.   On: 03/11/2023 17:15    IMPRESSION: Hematochezia, no further Abnormal CT imaging  -Diffuse wall thickening of colon from left transverse through proximal sigmoid  -No bowel movements overnight, unable to complete stool testing Abdominal pain/cramping, resolved Personal history high risk colon polyps  -Colonoscopy 09/23/2016 (Dr. Angelyn Punt mm sigmoid colon polyp, 15 mm rectum polyp, 6 mm sigmoid polyp, 6 mm rectosigmoid polyp, internal hemorrhoids Normocytic anemia  PLAN: -Recommend colonoscopy to evaluate abnormal CT imaging as well as personal history colon polyps -I personally discussed the benefits, alternatives and risks of colonoscopy procedure with patient including bleeding/infection/perforation/missed lesion/polyp/anesthesia risk, she verbalized understanding and elected to proceed -Complete stool studies if able -Clear liquid diet today -Bowel preparation this afternoon, n.p.o. at midnight for colonoscopy 03/14/2023 -Recommendations to follow pending endoscopy   LOS: 2 days   Danton Clap, [facility]

## 2023-03-14 ENCOUNTER — Encounter (HOSPITAL_COMMUNITY): Payer: Self-pay | Admitting: Internal Medicine

## 2023-03-14 ENCOUNTER — Inpatient Hospital Stay (HOSPITAL_COMMUNITY): Payer: Commercial Managed Care - HMO | Admitting: Anesthesiology

## 2023-03-14 ENCOUNTER — Encounter (HOSPITAL_COMMUNITY)
Admission: EM | Disposition: A | Discharge status: Home / Self Care | Payer: Self-pay | Source: Home / Self Care | Attending: Internal Medicine

## 2023-03-14 DIAGNOSIS — I10 Essential (primary) hypertension: Secondary | ICD-10-CM | POA: Diagnosis not present

## 2023-03-14 DIAGNOSIS — F17210 Nicotine dependence, cigarettes, uncomplicated: Secondary | ICD-10-CM

## 2023-03-14 DIAGNOSIS — K5730 Diverticulosis of large intestine without perforation or abscess without bleeding: Secondary | ICD-10-CM | POA: Diagnosis not present

## 2023-03-14 DIAGNOSIS — K922 Gastrointestinal hemorrhage, unspecified: Secondary | ICD-10-CM | POA: Diagnosis not present

## 2023-03-14 DIAGNOSIS — D126 Benign neoplasm of colon, unspecified: Secondary | ICD-10-CM

## 2023-03-14 DIAGNOSIS — K6389 Other specified diseases of intestine: Secondary | ICD-10-CM

## 2023-03-14 HISTORY — PX: POLYPECTOMY: SHX5525

## 2023-03-14 HISTORY — PX: BIOPSY: SHX5522

## 2023-03-14 HISTORY — PX: COLONOSCOPY: SHX5424

## 2023-03-14 LAB — HEMOGLOBIN AND HEMATOCRIT, BLOOD
HCT: 35 % — ABNORMAL LOW (ref 36.0–46.0)
Hemoglobin: 11.9 g/dL — ABNORMAL LOW (ref 12.0–15.0)

## 2023-03-14 SURGERY — COLONOSCOPY
Anesthesia: Monitor Anesthesia Care

## 2023-03-14 MED ORDER — HYDROCODONE-ACETAMINOPHEN 5-325 MG PO TABS: 1.0000 | ORAL_TABLET | Freq: Four times a day (QID) | ORAL | 0 refills | Status: AC | PRN

## 2023-03-14 MED ORDER — LACTATED RINGERS IV SOLN: INTRAVENOUS | Status: DC | PRN

## 2023-03-14 MED ORDER — PROPOFOL 10 MG/ML IV BOLUS
INTRAVENOUS | Status: DC | PRN
  Administered 2023-03-14: 30 mg via INTRAVENOUS

## 2023-03-14 MED ORDER — PROPOFOL 500 MG/50ML IV EMUL
INTRAVENOUS | Status: DC | PRN
  Administered 2023-03-14: 125 ug/kg/min via INTRAVENOUS

## 2023-03-14 NOTE — Discharge Summary (Signed)
Physician Discharge Summary  Belinda Rhodes [id number] DOB: [birth year] DOA: 03/11/2023  PCP: Charolette Forward, MD  Admit date: 03/11/2023 Discharge date: 03/14/2023  Admitted From: Home Disposition: Home  Recommendations for Outpatient Follow-up:  Follow up with PCP in 1-2 weeks GI clinic will schedule follow-up  Home Health: N/A Equipment/Devices: N/A  Discharge Condition: Stable CODE STATUS: Full code Diet recommendation: Low-salt diet  Discharge summary: Patient is 62 year old female with history of hypertension, hyperlipidemia and hypothyroidism who presented to the emergency room with bloody stool for 1 day, cramping.  No prior history of GI bleeding.  Hemoglobin is stable in the ER.  CT scan with diffuse left-sided colitis.  Admitted with GI consultation.  # Acute lower GI bleeding secondary to left-sided inflammatory colitis: Presented with crampy abdominal pain and rectal bleeding.  CT scan as above with left-sided colitis. Patient did not have any diarrhea or bloody bowel movements since admission.  Hemoglobin remained stable.  She was given bowel prep and without evidence of bleeding again. Underwent colonoscopy 3/30 with  biopsy (undetermined if related to bowel preparation versus other), left colon diverticulosis, sigmoid colon polyp x 2 status post cold snare polypectomy, normal appearing terminal ileum, poor prep  Patient does have a history of hyperplastic polyps.  GI will schedule follow-up for another colonoscopy in 6 months. Antibiotics discontinued. Does not have any ongoing diarrhea to test for infectious causes. Currently asymptomatic.  Going home with high-fiber diet, avoid constipation.  Biopsy to be followed up by GI clinic.   Essential hypertension: Blood pressures are adequate.  Resume home medications including amlodipine and losartan.   Hyperlipidemia: On Lipitor at home.  Resume on discharge.   Hypothyroidism: On Synthroid.   Nicotine dependence:  Counseled to quit.   Right foot pain: Patient has sudden onset of right foot pain,, mostly on the dorsum of the foot. She has some swelling without fluctuation.  Joints are nontender.  Uric acid is normal. Complete x-ray of the foot with chronic arthritis changes, no acute findings. Likely blunt trauma. Hot compress.  Continue to mobilize.  Pain medications to reduce swelling.  Stable for discharge today.   Discharge Diagnoses:  Principal Problem:   Lower GI bleed Active Problems:   Colitis   Essential hypertension   Hyperlipidemia   Hypothyroidism   Tobacco abuse    Discharge Instructions  Discharge Instructions     Call MD for:  redness, tenderness, or signs of infection (pain, swelling, redness, odor or green/yellow discharge around incision site)   Complete by: As directed    Call MD for:  severe uncontrolled pain   Complete by: As directed    Diet - low sodium heart healthy   Complete by: As directed    High fiber diet   Discharge instructions   Complete by: As directed    Can use hot compress right foot  Can go back to work on 03/18/2023   Increase activity slowly   Complete by: As directed       Allergies as of 03/14/2023   No Known Allergies      Medication List     TAKE these medications    amLODipine 5 MG tablet Commonly known as: NORVASC Take 5 mg by mouth daily.   atorvastatin 40 MG tablet Commonly known as: LIPITOR Take 40 mg by mouth daily.   GOODY HEADACHE PO Take 1 packet by mouth every 8 (eight) hours as needed (pain, headache).   HYDROcodone-acetaminophen 5-325 MG tablet Commonly known as: NORCO/VICODIN  Take 1 tablet by mouth every 6 (six) hours as needed for up to 3 days for moderate pain.   ibuprofen 200 MG tablet Commonly known as: ADVIL Take 600 mg by mouth daily as needed (pain).   levothyroxine 50 MCG tablet Commonly known as: SYNTHROID Take 50 mcg by mouth daily before breakfast.   losartan 100 MG tablet Commonly  known as: COZAAR Take 100 mg by mouth daily.        No Known Allergies  Consultations: Gastroenterology   Procedures/Studies: DG Foot Complete Right  Result Date: 03/13/2023 CLINICAL DATA:  Right foot pain EXAM: RIGHT FOOT COMPLETE - 3+ VIEW COMPARISON:  None Available. FINDINGS: Plantar calcaneal spur. Chronically fragmented spurring versus calcific tendinopathy in the distal Achilles tendon. Bifid medial first digit sesamoid. No fracture or acute bony findings. IMPRESSION: 1. Plantar calcaneal spur. 2. Chronically fragmented spurring versus calcific tendinopathy in the distal Achilles tendon. 3. No acute findings. Electronically Signed   By: Van Clines M.D.   On: 03/13/2023 16:54   CT Abdomen Pelvis W Contrast  Result Date: 03/11/2023 CLINICAL DATA:  Left lower quadrant pain. Bloody stools beginning yesterday. EXAM: CT ABDOMEN AND PELVIS WITH CONTRAST TECHNIQUE: Multidetector CT imaging of the abdomen and pelvis was performed using the standard protocol following bolus administration of intravenous contrast. RADIATION DOSE REDUCTION: This exam was performed according to the departmental dose-optimization program which includes automated exposure control, adjustment of the mA and/or kV according to patient size and/or use of iterative reconstruction technique. CONTRAST:  68mL OMNIPAQUE IOHEXOL 350 MG/ML SOLN COMPARISON:  05/16/2004. FINDINGS: Lower chest: Clear lung bases. Hepatobiliary: Liver normal in size and overall attenuation. 3 low-attenuation liver lesions, largest posterior aspect of segment 3, 2.3 cm, next largest dome of the right lobe, segment 7, 7 mm. Tiny lesion noted in segment 4 B. These are consistent with cysts. No other liver abnormality. Normal gallbladder. No bile duct dilation. Pancreas: Unremarkable. No pancreatic ductal dilatation or surrounding inflammatory changes. Spleen: Normal in size without focal abnormality. Adrenals/Urinary Tract: Adrenal glands are  unremarkable. Kidneys are normal, without renal calculi, focal lesion, or hydronephrosis. Bladder is unremarkable. Stomach/Bowel: Diffuse wall thickening of the left colon beginning at the left transverse colon extending across the splenic flexure and throughout the descending portion into the proximal sigmoid colon. No significant adjacent inflammatory changes. There are multiple diverticula of the sigmoid colon without evidence of diverticulitis. Normal stomach. Small bowel is normal in caliber. No wall thickening or inflammation. No evidence of appendicitis. Vascular/Lymphatic: Minor aortic atherosclerosis. No aneurysm. Prominent to mildly enlarged gastrohepatic ligament lymph nodes, largest 1.3 cm in short axis. No other enlarged lymph nodes. Reproductive: Status post hysterectomy. No adnexal masses. Other: No abdominal wall hernia or abnormality. No abdominopelvic ascites. Musculoskeletal: No fracture or acute finding.  No bone lesion. IMPRESSION: 1. Diffuse wall thickening of the left colon, from the left transverse colon through the proximal sigmoid, consistent with an infectious or inflammatory colitis. 2. Sigmoid colon diverticula, but no evidence of diverticulitis. 3. No other acute abnormality within the abdomen or pelvis. 4. Minor aortic atherosclerosis. Electronically Signed   By: Lajean Manes M.D.   On: 03/11/2023 17:15   (Echo, Carotid, EGD, Colonoscopy, ERCP)    Subjective: Patient seen and examined.  Came back from colonoscopy.  Results explained. Right foot is less painful than yesterday but is still somehow swollen on the dorsum.  No tenderness on palpation.  She has some pain on walking.   Discharge Exam: Vitals:  03/14/23 0845 03/14/23 0916  BP: (!) 152/69 (!) 164/72  Pulse: 63 (!) 59  Resp: 15 16  Temp:  98.8 F (37.1 C)  SpO2: 98%    Vitals:   03/14/23 0830 03/14/23 0840 03/14/23 0845 03/14/23 0916  BP: (!) 123/98  (!) 152/69 (!) 164/72  Pulse: 67 63 63 (!) 59  Resp: 17  15 15 16   Temp:    98.8 F (37.1 C)  TempSrc:    Oral  SpO2: 99% 98% 98%   Weight:      Height:        General: Pt is alert, awake, not in acute distress Cardiovascular: RRR, S1/S2 +, no rubs, no gallops Respiratory: CTA bilaterally, no wheezing, no rhonchi Abdominal: Soft, NT, ND, bowel sounds + Extremities: no edema, no cyanosis Right dorsal midfoot with minimal swelling, no joint tenderness, no bony tenderness.  No fluctuation.  No erythema and normal temperature.   The results of significant diagnostics from this hospitalization (including imaging, microbiology, ancillary and laboratory) are listed below for reference.     Microbiology: Recent Results (from the past 240 hour(s))  MRSA Next Gen by PCR, Nasal     Status: None   Collection Time: 03/11/23 11:23 PM   Specimen: Nasal Mucosa; Nasal Swab  Result Value Ref Range Status   MRSA by PCR Next Gen NOT DETECTED NOT DETECTED Final    Comment: (NOTE) The GeneXpert MRSA Assay (FDA approved for NASAL specimens only), is one component of a comprehensive MRSA colonization surveillance program. It is not intended to diagnose MRSA infection nor to guide or monitor treatment for MRSA infections. Test performance is not FDA approved in patients less than 70 years old. Performed at [facility], [place] [address] [place], [place] [address]      Labs: BNP (last 3 results) No results for input(s): "BNP" in the last 8760 hours. Basic Metabolic Panel: Recent Labs  Lab 03/11/23 1145 03/11/23 1252 03/12/23 0118 03/12/23 0431  NA 136 139  --  138  K 3.4* 3.4*  --  3.6  CL 102 104  --  106  CO2 23  --   --  22  GLUCOSE 95 93  --  79  BUN 10 10  --  13  CREATININE 0.89 0.70  --  0.82  CALCIUM 9.1  --   --  8.7*  MG  --   --  2.0 2.0  PHOS  --   --  3.5 3.6   Liver Function Tests: Recent Labs  Lab 03/11/23 1145 03/12/23 0431  AST 20 17  ALT 14 16  ALKPHOS 53 42  BILITOT 0.8 0.8  PROT 7.2 6.0*  ALBUMIN 3.7  3.1*   No results for input(s): "LIPASE", "AMYLASE" in the last 168 hours. No results for input(s): "AMMONIA" in the last 168 hours. CBC: Recent Labs  Lab 03/11/23 1145 03/11/23 1252 03/11/23 2243 03/12/23 0431 03/12/23 1046 03/12/23 1659 03/12/23 2216 03/14/23 0021  WBC 9.2  --  7.5 7.6 7.1 7.9 8.4  --   NEUTROABS 6.2  --   --   --   --   --   --   --   HGB 13.8   < > 11.9* 12.4 12.4 11.6* 11.8* 11.9*  HCT 42.7   < > 36.9 38.6 37.4 36.2 35.7* 35.0*  MCV 93.4  --  94.1 92.8 92.8 93.5 92.0  --   PLT 224  --  198 197 188 194 176  --    < > =  values in this interval not displayed.   Cardiac Enzymes: No results for input(s): "CKTOTAL", "CKMB", "CKMBINDEX", "TROPONINI" in the last 168 hours. BNP: Invalid input(s): "POCBNP" CBG: No results for input(s): "GLUCAP" in the last 168 hours. D-Dimer No results for input(s): "DDIMER" in the last 72 hours. Hgb A1c No results for input(s): "HGBA1C" in the last 72 hours. Lipid Profile No results for input(s): "CHOL", "HDL", "LDLCALC", "TRIG", "CHOLHDL", "LDLDIRECT" in the last 72 hours. Thyroid function studies Recent Labs    03/12/23 0118  TSH 20.263*   Anemia work up No results for input(s): "VITAMINB12", "FOLATE", "FERRITIN", "TIBC", "IRON", "RETICCTPCT" in the last 72 hours. Urinalysis No results found for: "COLORURINE", "APPEARANCEUR", "LABSPEC", "PHURINE", "GLUCOSEU", "HGBUR", "BILIRUBINUR", "KETONESUR", "PROTEINUR", "UROBILINOGEN", "NITRITE", "LEUKOCYTESUR" Sepsis Labs Recent Labs  Lab 03/12/23 0431 03/12/23 1046 03/12/23 1659 03/12/23 2216  WBC 7.6 7.1 7.9 8.4   Microbiology Recent Results (from the past 240 hour(s))  MRSA Next Gen by PCR, Nasal     Status: None   Collection Time: 03/11/23 11:23 PM   Specimen: Nasal Mucosa; Nasal Swab  Result Value Ref Range Status   MRSA by PCR Next Gen NOT DETECTED NOT DETECTED Final    Comment: (NOTE) The GeneXpert MRSA Assay (FDA approved for NASAL specimens only), is one  component of a comprehensive MRSA colonization surveillance program. It is not intended to diagnose MRSA infection nor to guide or monitor treatment for MRSA infections. Test performance is not FDA approved in patients less than 93 years old. Performed at [facility], [place] [address] [place], [place] [address]      Time coordinating discharge:  35 minutes  SIGNED:   Barb Merino, MD  [facility] 03/14/2023, 10:13 AM

## 2023-03-14 NOTE — Interval H&P Note (Signed)
History and Physical Interval Note:  03/14/2023 7:25 AM  Belinda Rhodes  has presented today for surgery, with the diagnosis of Blood per rectum, abnormal CT imaging.  The various methods of treatment have been discussed with the patient and family. After consideration of risks, benefits and other options for treatment, the patient has consented to  Procedure(s): COLONOSCOPY (N/A) as a surgical intervention.  The patient's history has been reviewed, patient examined, no change in status, stable for surgery.  I have reviewed the patient's chart and labs.  Questions were answered to the patient's satisfaction.     Loney Laurence

## 2023-03-14 NOTE — Anesthesia Postprocedure Evaluation (Signed)
Anesthesia Post Note  Patient: Belinda Rhodes  Procedure(s) Performed: COLONOSCOPY BIOPSY POLYPECTOMY     Patient location during evaluation: Endoscopy Anesthesia Type: MAC Level of consciousness: awake and alert, patient cooperative and oriented Pain management: pain level controlled Vital Signs Assessment: post-procedure vital signs reviewed and stable Respiratory status: spontaneous breathing, nonlabored ventilation and respiratory function stable Cardiovascular status: blood pressure returned to baseline and stable Postop Assessment: no apparent nausea or vomiting Anesthetic complications: no   No notable events documented.  Last Vitals:  Vitals:   03/14/23 0845 03/14/23 0916  BP: (!) 152/69 (!) 164/72  Pulse: 63 (!) 59  Resp: 15 16  Temp:  37.1 C  SpO2: 98%     Last Pain:  Vitals:   03/14/23 0916  TempSrc: Oral  PainSc: 8                  Leonidas Boateng,E. Mohogany Toppins

## 2023-03-14 NOTE — Transfer of Care (Signed)
Immediate Anesthesia Transfer of Care Note  Patient: Belinda Rhodes  Procedure(s) Performed: COLONOSCOPY BIOPSY POLYPECTOMY  Patient Location: Endoscopy Unit  Anesthesia Type:MAC  Level of Consciousness: drowsy  Airway & Oxygen Therapy: Patient Spontanous Breathing and Patient connected to nasal cannula oxygen  Post-op Assessment: Report given to RN and Post -op Vital signs reviewed and stable  Post vital signs: Reviewed and stable  Last Vitals:  Vitals Value Taken Time  BP 142/79 03/14/23 0827  Temp    Pulse 71 03/14/23 0828  Resp 20 03/14/23 0828  SpO2 100 % 03/14/23 0828  Vitals shown include unvalidated device data.  Last Pain:  Vitals:   03/14/23 0828  TempSrc:   PainSc: 0-No pain      Patients Stated Pain Goal: 0 (0000000 [id number])  Complications: No notable events documented.

## 2023-03-14 NOTE — Op Note (Signed)
[facility] Patient Name: Belinda Rhodes Procedure Date : 03/14/2023 MRN: [id number] Attending MD: Danton Clap DO, DO, [id number] Date of Birth: 09/09/1961 CSN: [id number] Age: 62 Admit Type: Inpatient Procedure:                Colonoscopy Indications:              Hematochezia, Heme positive stool, Adenomatous                            polyps in the colon, Abnormal CT of the GI tract Providers:                Danton Clap DO, DO, Carlyn Reichert, RN, William Dalton, Technician Referring MD:              Medicines:                See the Anesthesia note for documentation of the                            administered medications Complications:            No immediate complications. Estimated Blood Loss:     Estimated blood loss was minimal. Procedure:                Pre-Anesthesia Assessment:                           - ASA Grade Assessment: II - A patient with mild                            systemic disease.                           - The risks and benefits of the procedure and the                            sedation options and risks were discussed with the                            patient. All questions were answered and informed                            consent was obtained.                           After obtaining informed consent, the colonoscope                            was passed under direct vision. Throughout the                            procedure, the patient's blood pressure, pulse, and                            oxygen saturations  were monitored continuously. The                            [id number] Olympus peds colonoscope was                            introduced through the anus and advanced to the the                            terminal ileum. The colonoscopy was somewhat                            difficult due to inadequate bowel prep. The patient                            tolerated the  procedure well. The quality of the                            bowel preparation was evaluated using the BBPS                            [facility] Bowel Preparation Scale) with scores of:                            Right Colon = 2 (minor amount of residual staining,                            small fragments of stool and/or opaque liquid, but                            mucosa seen well), Transverse Colon = 2 (minor                            amount of residual staining, small fragments of                            stool and/or opaque liquid, but mucosa seen well)                            and Left Colon = 1 (portion of mucosa seen, but                            other areas not well seen due to staining, residual                            stool and/or opaque liquid). The total BBPS score                            equals 5. The quality of the bowel preparation was                            inadequate. Scope In: 7:42:54 AM Scope Out: 8:22:41 AM  Scope Withdrawal Time: 0 hours 22 minutes 49 seconds  Total Procedure Duration: 0 hours 39 minutes 47 seconds  Findings:      The perianal and digital rectal examinations were normal.      Multiple medium-mouthed and small-mouthed diverticula were found in the       left colon.      A 4 mm polyp was found in the sigmoid colon. The polyp was sessile. The       polyp was removed with a cold snare. Resection and retrieval were       complete.      A 3 mm polyp was found in the sigmoid colon. The polyp was sessile. The       polyp was removed with a cold snare. Resection and retrieval were       complete.      A localized area of mildly erythematous mucosa was found in the       descending colon. This was biopsied with a cold forceps for histology.      The terminal ileum appeared normal. Impression:               - Preparation of the colon was inadequate.                           - Diverticulosis in the left colon.                           - One  4 mm polyp in the sigmoid colon, removed with                            a cold snare. Resected and retrieved.                           - One 3 mm polyp in the sigmoid colon, removed with                            a cold snare. Resected and retrieved.                           - Erythematous mucosa in the descending colon.                            Biopsied.                           - The examined portion of the ileum was normal. Recommendation:           - Return patient to hospital ward for ongoing care.                           - High fiber diet.                           - Continue present medications.                           - Await pathology results.                           -  Repeat colonoscopy in 6 months with primary                            gastroenterologist (Dr. Otis Brace) because                            the bowel preparation was poor (recommend 2 day                            bowel preparation).                           - Return to GI office in 2 months. Procedure Code(s):        --- Professional ---                           [id number], Colonoscopy, flexible; with removal of                            tumor(s), polyp(s), or other lesion(s) by snare                            technique                           45380, 65, Colonoscopy, flexible; with biopsy,                            single or multiple Diagnosis Code(s):        --- Professional ---                           D12.5, Benign neoplasm of sigmoid colon                           K63.89, Other specified diseases of intestine                           K92.1, Melena (includes Hematochezia)                           R19.5, Other fecal abnormalities                           D12.6, Benign neoplasm of colon, unspecified                           K57.30, Diverticulosis of large intestine without                            perforation or abscess without bleeding                           R93.3, Abnormal  findings on diagnostic imaging of                            other parts  of digestive tract CPT copyright 2022 American Medical Association. All rights reserved. The codes documented in this report are preliminary and upon coder review may  be revised to meet current compliance requirements. Dr Danton Clap, DO Danton Clap DO, DO 03/14/2023 8:39:35 AM Number of Addenda: 0

## 2023-03-14 NOTE — Brief Op Note (Signed)
03/14/2023  8:24 AM  PATIENT:  Belinda Rhodes  62 y.o. female  PRE-OPERATIVE DIAGNOSIS:  Blood per rectum, abnormal CT imaging  POST-OPERATIVE DIAGNOSIS: right side colitis status post biopsy (undetermined if related to bowel preparation versus other), left colon diverticulosis, sigmoid colon polyp x 2 status post cold snare polypectomy, normal appearing terminal ileum, poor prep  PROCEDURE:  Procedure(s): COLONOSCOPY (N/A) BIOPSY POLYPECTOMY  SURGEON:  Surgeon(s) and Role:    Loney Laurence, DO - Primary  Recommendations:  -Ok to complete stool studies for completeness -Follow up biopsy specimens from today's procedure -High fiber diet indefinitely for diverticulosis -Ok for diet today -If patient feeling well, can discharge from hospital today -Given poor prep on today's exam and history of high risk polyps, would recommend repeat colonoscopy within next 6 months as outpatient with her primary gastroenterologist (Dr. Otis Brace)  Danton Clap, [facility]

## 2023-03-14 NOTE — TOC Transition Note (Signed)
Transition of Care [facility]) - CM/SW Discharge Note   Patient Details  Name: Belinda Rhodes MRN: [id number] Date of Birth: 03-16-1961  Transition of Care [facility]) CM/SW Contact:  Tom-Johnson, Renea Ee, RN Phone Number: 03/14/2023, 11:47 AM   Clinical Narrative:     Patient is scheduled for discharge today. Readmission Prevention Assessment done. Hospital f/u and discharge instructions on AVS.  No TOC needs or recommendations noted.   Family to transport at discharge. No further TOC needs noted.   Final next level of care: Home/Self Care Barriers to Discharge: Barriers Resolved   Patient Goals and CMS Choice CMS Medicare.gov Compare Post Acute Care list provided to:: Patient Choice offered to / list presented to : NA  Discharge Placement                  Patient to be transferred to facility by: family      Discharge Plan and Services Additional resources added to the After Visit Summary for                  DME Arranged: N/A DME Agency: NA       HH Arranged: NA HH Agency: NA        Social Determinants of Health (SDOH) Interventions SDOH Screenings   Tobacco Use: High Risk (03/14/2023)     Readmission Risk Interventions    03/14/2023   11:46 AM  Readmission Risk Prevention Plan  Post Dischage Appt Complete  Medication Screening Complete  Transportation Screening Complete

## 2023-03-14 NOTE — Evaluation (Signed)
Physical Therapy Evaluation/ discharge Patient Details Name: Belinda Rhodes MRN: [id number] DOB: Jan 01, 1961 Today's Date: 03/14/2023  History of Present Illness  62 yo female admitted 3/27 with GIB s/p colonoscopy 3/30. PMhx: HTN, HLD, hypothyroidism  Clinical Impression  Pt pleasant and reports Rt foot pain starting 3/28 with negative xray. Pt with noted edema and erythema along 1st metatarsal with pain to pressure or weight bearing. Pt able to walk without AD or physical assist with antalgic gait and educated for compression and elevation with ACE wrap applied. Pt denied need for AD to aid off weighting of RLE with gait and reports understanding of education with recommendation for follow up if pain persists >2wks. No further acute therapy needs and will sign off with pt aware and agreeable.        Recommendations for follow up therapy are one component of a multi-disciplinary discharge planning process, led by the attending physician.  Recommendations may be updated based on patient status, additional functional criteria and insurance authorization.  Follow Up Recommendations       Assistance Recommended at Discharge PRN  Patient can return home with the following       Equipment Recommendations None recommended by PT  Recommendations for Other Services       Functional Status Assessment Patient has not had a recent decline in their functional status     Precautions / Restrictions Precautions Precautions: None Restrictions Weight Bearing Restrictions: No      Mobility  Bed Mobility Overal bed mobility: Modified Independent                  Transfers Overall transfer level: Modified independent                      Ambulation/Gait Ambulation/Gait assistance: Modified independent (Device/Increase time) Gait Distance (Feet): 300 Feet Assistive device: None, Rolling walker (2 wheels) Gait Pattern/deviations: Step-through pattern, Decreased stance  time - right, Antalgic   Gait velocity interpretation: >2.62 ft/sec, indicative of community ambulatory   General Gait Details: pt with Rt foot pain with antalgic gait. pt walked 150' with RW with cues for sequence but reports she does not want RW for home and walked return gait without AD.  Stairs Stairs: Yes Stairs assistance: Modified independent (Device/Increase time) Stair Management: Step to pattern, Forwards Number of Stairs: 11 General stair comments: pt with use of rail to off weight RLE with cues for sequence, no physical assist  Wheelchair Mobility    Modified Rankin (Stroke Patients Only)       Balance Overall balance assessment: No apparent balance deficits (not formally assessed)                                           Pertinent Vitals/Pain Pain Assessment Pain Assessment: 0-10 Pain Score: 4  Pain Location: Rt foot along 1st metatarsal Pain Descriptors / Indicators: Aching, Sore Pain Intervention(s): Limited activity within patient's tolerance, Repositioned, Monitored during session, Other (comment) (ACE wrap compression)    Home Living Family/patient expects to be discharged to:: Private residence Living Arrangements: Children Available Help at Discharge: Family;Available 24 hours/day Type of Home: House Home Access: Stairs to enter   [employer] of Steps: 14   Home Layout: One level Home Equipment: None Additional Comments: 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, water quality  Prior Function Prior Level of Function : Independent/Modified Independent                     Hand Dominance        Extremity/Trunk Assessment   Upper Extremity Assessment Upper Extremity Assessment: Overall WFL for tasks assessed    Lower Extremity Assessment Lower Extremity Assessment: Overall WFL for tasks assessed    Cervical / Trunk Assessment Cervical / Trunk Assessment: Normal  Communication    Communication: No difficulties  Cognition Arousal/Alertness: Awake/alert Behavior During Therapy: WFL for tasks assessed/performed Overall Cognitive Status: Within Functional Limits for tasks assessed                                          General Comments      Exercises     Assessment/Plan    PT Assessment Patient does not need any further PT services  PT Problem List         PT Treatment Interventions      PT Goals (Current goals can be found in the Care Plan section)  Acute Rehab PT Goals PT Goal Formulation: All assessment and education complete, DC therapy    Frequency       Co-evaluation               AM-PAC PT "6 Clicks" Mobility  Outcome Measure Help needed turning from your back to your side while in a flat bed without using bedrails?: None Help needed moving from lying on your back to sitting on the side of a flat bed without using bedrails?: None Help needed moving to and from a bed to a chair (including a wheelchair)?: None Help needed standing up from a chair using your arms (e.g., wheelchair or bedside chair)?: None Help needed to walk in hospital room?: None Help needed climbing 3-5 steps with a railing? : None 6 Click Score: 24    End of Session   Activity Tolerance: Patient tolerated treatment well Patient left: in chair;with call bell/phone within reach Nurse Communication: Mobility status PT Visit Diagnosis: Other abnormalities of gait and mobility (R26.89);Pain Pain - Right/Left: Right Pain - part of body: Ankle and joints of foot    Time: 1134-1201 PT Time Calculation (min) (ACUTE ONLY): 27 min   Charges:   PT Evaluation $PT Eval Low Complexity: 1 Low PT Treatments $Therapeutic Activity: 8-22 mins        Bayard Males, PT Acute Rehabilitation Services Office: [phone]   Sandy Salaam Juniel Groene 03/14/2023, 12:12 PM

## 2023-03-15 LAB — TYPE AND SCREEN
ABO/RH(D): O POS
Antibody Screen: POSITIVE
Unit division: 0
Unit division: 0

## 2023-03-15 LAB — BPAM RBC
Blood Product Expiration Date: 202404232359
Blood Product Expiration Date: 202404232359
Unit Type and Rh: 5100
Unit Type and Rh: 5100

## 2023-03-16 ENCOUNTER — Encounter (HOSPITAL_COMMUNITY): Payer: Self-pay | Admitting: Internal Medicine

## 2023-03-18 LAB — SURGICAL PATHOLOGY
